# Patient Record
Sex: Female | Born: 1985 | Race: Black or African American | Hispanic: No | Marital: Single | Smoking: Never smoker
Health system: Southern US, Community
[De-identification: ages and names within clinical notes are randomized; demographics above are authoritative.]

## PROBLEM LIST (undated history)

## (undated) DIAGNOSIS — B999 Unspecified infectious disease: Secondary | ICD-10-CM

## (undated) DIAGNOSIS — O24419 Gestational diabetes mellitus in pregnancy, unspecified control: Secondary | ICD-10-CM

## (undated) DIAGNOSIS — L309 Dermatitis, unspecified: Secondary | ICD-10-CM

## (undated) DIAGNOSIS — J45909 Unspecified asthma, uncomplicated: Secondary | ICD-10-CM

---

## 2020-02-22 ENCOUNTER — Inpatient Hospital Stay (HOSPITAL_COMMUNITY)
Admission: EM | Admit: 2020-02-22 | Discharge: 2020-02-22 | Disposition: A | Discharge status: Home / Self Care | Payer: Medicaid Other | Attending: Obstetrics and Gynecology | Admitting: Obstetrics and Gynecology

## 2020-02-22 ENCOUNTER — Encounter (HOSPITAL_COMMUNITY): Payer: Self-pay | Admitting: Emergency Medicine

## 2020-02-22 ENCOUNTER — Other Ambulatory Visit: Payer: Self-pay

## 2020-02-22 DIAGNOSIS — J45909 Unspecified asthma, uncomplicated: Secondary | ICD-10-CM | POA: Insufficient documentation

## 2020-02-22 DIAGNOSIS — Z3A01 Less than 8 weeks gestation of pregnancy: Secondary | ICD-10-CM

## 2020-02-22 DIAGNOSIS — O219 Vomiting of pregnancy, unspecified: Secondary | ICD-10-CM | POA: Diagnosis not present

## 2020-02-22 HISTORY — DX: Unspecified asthma, uncomplicated: J45.909

## 2020-02-22 LAB — CBC WITH DIFFERENTIAL/PLATELET
Abs Immature Granulocytes: 0.02 10*3/uL (ref 0.00–0.07)
Basophils Absolute: 0.1 10*3/uL (ref 0.0–0.1)
Basophils Relative: 1 %
Eosinophils Absolute: 0.1 10*3/uL (ref 0.0–0.5)
Eosinophils Relative: 1 %
HCT: 41.7 % (ref 36.0–46.0)
Hemoglobin: 14.5 g/dL (ref 12.0–15.0)
Immature Granulocytes: 0 %
Lymphocytes Relative: 25 %
Lymphs Abs: 1.9 10*3/uL (ref 0.7–4.0)
MCH: 31.7 pg (ref 26.0–34.0)
MCHC: 34.8 g/dL (ref 30.0–36.0)
MCV: 91.2 fL (ref 80.0–100.0)
Monocytes Absolute: 0.9 10*3/uL (ref 0.1–1.0)
Monocytes Relative: 11 %
Neutro Abs: 4.8 10*3/uL (ref 1.7–7.7)
Neutrophils Relative %: 62 %
Platelets: 381 10*3/uL (ref 150–400)
RBC: 4.57 MIL/uL (ref 3.87–5.11)
RDW: 11.8 % (ref 11.5–15.5)
WBC: 7.6 10*3/uL (ref 4.0–10.5)
nRBC: 0 % (ref 0.0–0.2)

## 2020-02-22 LAB — COMPREHENSIVE METABOLIC PANEL
ALT: 21 U/L (ref 0–44)
AST: 19 U/L (ref 15–41)
Albumin: 4.2 g/dL (ref 3.5–5.0)
Alkaline Phosphatase: 48 U/L (ref 38–126)
Anion gap: 12 (ref 5–15)
BUN: 9 mg/dL (ref 6–20)
CO2: 20 mmol/L — ABNORMAL LOW (ref 22–32)
Calcium: 9.8 mg/dL (ref 8.9–10.3)
Chloride: 102 mmol/L (ref 98–111)
Creatinine, Ser: 0.76 mg/dL (ref 0.44–1.00)
GFR, Estimated: >60 mL/min (ref >60)
Glucose, Bld: 106 mg/dL — ABNORMAL HIGH (ref 70–99)
Potassium: 3.8 mmol/L (ref 3.5–5.1)
Sodium: 134 mmol/L — ABNORMAL LOW (ref 135–145)
Total Bilirubin: 0.6 mg/dL (ref 0.3–1.2)
Total Protein: 7.5 g/dL (ref 6.5–8.1)

## 2020-02-22 LAB — URINALYSIS, ROUTINE W REFLEX MICROSCOPIC
Bilirubin Urine: NEGATIVE
Glucose, UA: NEGATIVE mg/dL
Hgb urine dipstick: NEGATIVE
Ketones, ur: 80 mg/dL — AB
Leukocytes,Ua: NEGATIVE
Nitrite: NEGATIVE
Protein, ur: 30 mg/dL — AB
Specific Gravity, Urine: 1.026 (ref 1.005–1.030)
pH: 6 (ref 5.0–8.0)

## 2020-02-22 LAB — I-STAT BETA HCG BLOOD, ED (MC, WL, AP ONLY): I-stat hCG, quantitative: >2000 m[IU]/mL — ABNORMAL HIGH (ref <5)

## 2020-02-22 LAB — LIPASE, BLOOD: Lipase: 24 U/L (ref 11–51)

## 2020-02-22 MED ORDER — ONDANSETRON 4 MG PO TBDP
4.0000 mg | ORAL_TABLET | Freq: Once | ORAL | Status: AC
  Administered 2020-02-22: 4 mg via ORAL

## 2020-02-22 MED ORDER — METOCLOPRAMIDE HCL 10 MG PO TABS: 10.0000 mg | ORAL_TABLET | Freq: Three times a day (TID) | ORAL | 1 refills | Status: DC

## 2020-02-22 MED ORDER — METOCLOPRAMIDE HCL 5 MG/ML IJ SOLN
10.0000 mg | Freq: Once | INTRAMUSCULAR | Status: AC
  Administered 2020-02-22: 10 mg via INTRAVENOUS
  Filled 2020-02-22: qty 2

## 2020-02-22 MED ORDER — LACTATED RINGERS IV BOLUS
1000.0000 mL | Freq: Once | INTRAVENOUS | Status: AC
  Administered 2020-02-22: 1000 mL via INTRAVENOUS

## 2020-02-22 MED ORDER — ONDANSETRON HCL 4 MG PO TABS: 4.0000 mg | ORAL_TABLET | Freq: Once | ORAL | Status: DC

## 2020-02-22 MED ORDER — ONDANSETRON 4 MG PO TBDP
ORAL_TABLET | ORAL | Status: AC
  Filled 2020-02-22: qty 1

## 2020-02-22 MED ORDER — FAMOTIDINE 20 MG PO TABS: 20.0000 mg | ORAL_TABLET | Freq: Every day | ORAL | 1 refills | Status: DC

## 2020-02-22 MED ORDER — FAMOTIDINE IN NACL 20-0.9 MG/50ML-% IV SOLN
20.0000 mg | Freq: Once | INTRAVENOUS | Status: AC
  Administered 2020-02-22: 20 mg via INTRAVENOUS
  Filled 2020-02-22: qty 50

## 2020-02-22 NOTE — Discharge Instructions (Signed)
Morning Sickness  Morning sickness is when a woman feels nauseous during pregnancy. This nauseous feeling may or may not come with vomiting. It often occurs in the morning, but it can be a problem at any time of day. Morning sickness is most common during the first trimester. In some cases, it may continue throughout pregnancy. Although morning sickness is unpleasant, it is usually harmless unless the woman develops severe and continual vomiting (hyperemesis gravidarum), a condition that requires more intense treatment. What are the causes? The exact cause of this condition is not known, but it seems to be related to normal hormonal changes that occur in pregnancy. What increases the risk? You are more likely to develop this condition if:  You experienced nausea or vomiting before your pregnancy.  You had morning sickness during a previous pregnancy.  You are pregnant with more than one baby, such as twins. What are the signs or symptoms? Symptoms of this condition include:  Nausea.  Vomiting. How is this diagnosed? This condition is usually diagnosed based on your signs and symptoms. How is this treated? In many cases, treatment is not needed for this condition. Making some changes to what you eat may help to control symptoms. Your health care provider may also prescribe or recommend:  Vitamin B6 supplements.  Anti-nausea medicines.  Ginger. Follow these instructions at home: Medicines  Take over-the-counter and prescription medicines only as told by your health care provider. Do not use any prescription, over-the-counter, or herbal medicines for morning sickness without first talking with your health care provider.  Taking multivitamins before getting pregnant can prevent or decrease the severity of morning sickness in most women. Eating and drinking  Eat a piece of dry toast or crackers before getting out of bed in the morning.  Eat 5 or 6 small meals a day.  Eat dry and  bland foods, such as rice or a baked potato. Foods that are high in carbohydrates are often helpful.  Avoid greasy, fatty, and spicy foods.  Have someone cook for you if the smell of any food causes nausea and vomiting.  If you feel nauseous after taking prenatal vitamins, take the vitamins at night or with a snack.  Snack on protein foods between meals if you are hungry. Nuts, yogurt, and cheese are good options.  Drink fluids throughout the day.  Try ginger ale made with real ginger, ginger tea made from fresh grated ginger, or ginger candies. General instructions  Do not use any products that contain nicotine or tobacco, such as cigarettes and e-cigarettes. If you need help quitting, ask your health care provider.  Get an air purifier to keep the air in your house free of odors.  Get plenty of fresh air.  Try to avoid odors that trigger your nausea.  Consider trying these methods to help relieve symptoms: ? Wearing an acupressure wristband. These wristbands are often worn for seasickness. ? Acupuncture. Contact a health care provider if:  Your home remedies are not working and you need medicine.  You feel dizzy or light-headed.  You are losing weight. Get help right away if:  You have persistent and uncontrolled nausea and vomiting.  You faint.  You have severe pain in your abdomen. Summary  Morning sickness is when a woman feels nauseous during pregnancy. This nauseous feeling may or may not come with vomiting.  Morning sickness is most common during the first trimester.  It often occurs in the morning, but it can be a problem at   any time of day.  In many cases, treatment is not needed for this condition. Making some changes to what you eat may help to control symptoms. This information is not intended to replace advice given to you by your health care provider. Make sure you discuss any questions you have with your health care provider. Document Revised:  03/04/2017 Document Reviewed: 04/24/2016 Elsevier Patient Education  2020 [place].        Hyperemesis Gravidarum Hyperemesis gravidarum is a severe form of nausea and vomiting that happens during pregnancy. Hyperemesis is worse than morning sickness. It may cause you to have nausea or vomiting all day for many days. It may keep you from eating and drinking enough food and liquids, which can lead to dehydration, malnutrition, and weight loss. Hyperemesis usually occurs during the first half (the first 20 weeks) of pregnancy. It often goes away once a woman is in her second half of pregnancy. However, sometimes hyperemesis continues through an entire pregnancy. What are the causes? The cause of this condition is not known. It may be related to changes in chemicals (hormones) in the body during pregnancy, such as the high level of pregnancy hormone (human chorionic gonadotropin) or the increase in the female sex hormone (estrogen). What are the signs or symptoms? Symptoms of this condition include:  Nausea that does not go away.  Vomiting that does not allow you to keep any food down.  Weight loss.  Body fluid loss (dehydration).  Having no desire to eat, or not liking food that you have previously enjoyed. How is this diagnosed? This condition may be diagnosed based on:  A physical exam.  Your medical history.  Your symptoms.  Blood tests.  Urine tests. How is this treated? This condition is managed by controlling symptoms. This may include:  Following an eating plan. This can help lessen nausea and vomiting.  Taking prescription medicines. An eating plan and medicines are often used together to help control symptoms. If medicines do not help relieve nausea and vomiting, you may need to receive fluids through an IV at the hospital. Follow these instructions at home: Eating and drinking   Avoid the following: ? Drinking fluids with meals. Try not to drink anything  during the 30 minutes before and after your meals. ? Drinking more than 1 cup of fluid at a time. ? Eating foods that trigger your symptoms. These may include spicy foods, coffee, high-fat foods, very sweet foods, and acidic foods. ? Skipping meals. Nausea can be more intense on an empty stomach. If you cannot tolerate food, do not force it. Try sucking on ice chips or other frozen items and make up for missed calories later. ? Lying down within 2 hours after eating. ? Being exposed to environmental triggers. These may include food smells, smoky rooms, closed spaces, rooms with strong smells, warm or humid places, overly loud and noisy rooms, and rooms with motion or flickering lights. Try eating meals in a well-ventilated area that is free of strong smells. ? Quick and sudden changes in your movement. ? Taking iron pills and multivitamins that contain iron. If you take prescription iron pills, do not stop taking them unless your health care provider approves. ? Preparing food. The smell of food can spoil your appetite or trigger nausea.  To help relieve your symptoms: ? Listen to your body. Everyone is different and has different preferences. Find what works best for you. ? Eat and drink slowly. ? Eat 5-6 small meals  daily instead of 3 large meals. Eating small meals and snacks can help you avoid an empty stomach. ? In the morning, before getting out of bed, eat a couple of crackers to avoid moving around on an empty stomach. ? Try eating starchy foods as these are usually tolerated well. Examples include cereal, toast, bread, potatoes, pasta, rice, and pretzels. ? Include at least 1 serving of protein with your meals and snacks. Protein options include lean meats, poultry, seafood, beans, nuts, nut butters, eggs, cheese, and yogurt. ? Try eating a protein-rich snack before bed. Examples of a protein-rick snack include cheese and crackers or a peanut butter sandwich made with 1 slice of whole-wheat  bread and 1 tsp (5 g) of peanut butter. ? Eat or suck on things that have ginger in them. It may help relieve nausea. Add  tsp ground ginger to hot tea or choose ginger tea. ? Try drinking 100% fruit juice or an electrolyte drink. An electrolyte drink contains sodium, potassium, and chloride. ? Drink fluids that are cold, clear, and carbonated or sour. Examples include lemonade, ginger ale, lemon-lime soda, ice water, and sparkling water. ? Brush your teeth or use a mouth rinse after meals. ? Talk with your health care provider about starting a supplement of vitamin B6. General instructions  Take over-the-counter and prescription medicines only as told by your health care provider.  Follow instructions from your health care provider about eating or drinking restrictions.  Continue to take your prenatal vitamins as told by your health care provider. If you are having trouble taking your prenatal vitamins, talk with your health care provider about different options.  Keep all follow-up and pre-birth (prenatal) visits as told by your health care provider. This is important. Contact a health care provider if:  You have pain in your abdomen.  You have a severe headache.  You have vision problems.  You are losing weight.  You feel weak or dizzy. Get help right away if:  You cannot drink fluids without vomiting.  You vomit blood.  You have constant nausea and vomiting.  You are very weak.  You faint.  You have a fever and your symptoms suddenly get worse. Summary  Hyperemesis gravidarum is a severe form of nausea and vomiting that happens during pregnancy.  Making some changes to your eating habits may help relieve nausea and vomiting.  This condition may be managed with medicine.  If medicines do not help relieve nausea and vomiting, you may need to receive fluids through an IV at the hospital. This information is not intended to replace advice given to you by your health  care provider. Make sure you discuss any questions you have with your health care provider. Document Revised: 04/11/2017 Document Reviewed: 11/19/2015 Elsevier Patient Education  2020 [employer].                        Safe Medications in Pregnancy    Acne: Benzoyl Peroxide Salicylic Acid  Backache/Headache: Tylenol: 2 regular strength every 4 hours OR              2 Extra strength every 6 hours  Colds/Coughs/Allergies: Benadryl (alcohol free) 25 mg every 6 hours as needed Breath right strips Claritin Cepacol throat lozenges Chloraseptic throat spray Cold-Eeze- up to three times per day Cough drops, alcohol free Flonase (by prescription only) Guaifenesin Mucinex Robitussin DM (plain only, alcohol free) Saline nasal spray/drops Sudafed (pseudoephedrine) & Actifed ** use only after  [redacted] weeks gestation and if you do not have high blood pressure Tylenol Vicks Vaporub Zinc lozenges Zyrtec   Constipation: Colace Ducolax suppositories Fleet enema Glycerin suppositories Metamucil Milk of magnesia Miralax Senokot Smooth move tea  Diarrhea: Kaopectate Imodium A-D  *NO pepto Bismol  Hemorrhoids: Anusol Anusol HC Preparation H Tucks  Indigestion: Tums Maalox Mylanta Zantac  Pepcid  Insomnia: Benadryl (alcohol free) 25mg every 6 hours as needed Tylenol PM Unisom, no Gelcaps  Leg Cramps: Tums MagGel  Nausea/Vomiting:  Bonine Dramamine Emetrol Ginger extract Sea bands Meclizine  Nausea medication to take during pregnancy:  Unisom (doxylamine succinate 25 mg tablets) Take one tablet daily at bedtime. If symptoms are not adequately controlled, the dose can be increased to a maximum recommended dose of two tablets daily (1/2 tablet in the morning, 1/2 tablet mid-afternoon and one at bedtime). Vitamin B6 100mg tablets. Take one tablet twice a day (up to 200 mg per day).  Skin Rashes: Aveeno products Benadryl cream or 25mg every 6 hours as  needed Calamine Lotion 1% cortisone cream  Yeast infection: Gyne-lotrimin 7 Monistat 7   **If taking multiple medications, please check labels to avoid duplicating the same active ingredients **take medication as directed on the label ** Do not exceed 4000 mg of tylenol in 24 hours **Do not take medications that contain aspirin or ibuprofen          Prenatal Care Providers           [facility] - accepts patients without insurance  Phone: [phone]  [facility] @ [facility]   Phone: [phone]  [facility] @[place]       Phone: [phone]            [facility] @ [place]     Phone: [phone]          [facility] @ [place]   Phone: [phone]  [facility] @ [facility] - accepts patients without insurance  Phone: [phone]  [facility] @ [facility] Phone: [phone]     [facility] - accepts patients without insurance Phone: [phone]  [facility]  Phone: [phone]  [facility] Phone: [phone]  [facility] Phone: [phone]  [facility] Phone: [phone]  [facility] Phone: [phone]  [facility]  Phone: [phone]   

## 2020-02-22 NOTE — MAU Note (Signed)
Presents with c/o N/V, reports can't keep anything down including water.  States been vomiting x3 days.  Reports feeling weak and light headed.

## 2020-02-22 NOTE — ED Triage Notes (Signed)
Emergency Medicine Provider Triage Evaluation Note  Claudia Lambert , a 34 y.o. female  was evaluated in triage.  Pt complains of vomiting in early pregnancy.  Review of Systems  Positive: Vomiting and abdominal pajn Negative: Vaginal bleeding  Physical Exam  BP (!) 130/103   Pulse (!) 105   Temp 98.5 F (36.9 C) (Oral)   Resp 20   Ht 5\' 6"  (1.676 m)   Wt 70.3 kg   LMP 01/01/2020   SpO2 99%   BMI 25.02 kg/m  Gen:   Awake, no distress   HEENT:  Atraumatic  Resp:  Normal effort  Cardiac:  Normal rate  Abd:   Nondistended, nontender  MSK:   Moves extremities without difficulty  Neuro:  Speech clear   Medical Decision Making  Medically screening exam initiated at 12:09 PM.  Appropriate orders placed.  [name] was informed that the remainder of the evaluation will be completed by another provider, this initial triage assessment does not replace that evaluation, and the importance of remaining in the ED until their evaluation is complete.  Clinical Impression  Patient with vomiting in early pregnancy  Accepted by APP Raelyn Number in MAU 12:12 PM    [name], PA-C 02/22/20 1213

## 2020-02-22 NOTE — ED Triage Notes (Signed)
States is pregnant, and has been vomiting constantly --- unable keep anything down. Started this last weekend.   LMP 9/28-- home test positive.

## 2020-02-22 NOTE — MAU Provider Note (Signed)
History     CSN: [id number]  Arrival date and time: 02/22/20 1116   First Provider Initiated Contact with Patient 02/22/20 1506      Chief Complaint  Patient presents with   Emesis During Pregnancy   Nausea   Emesis   Ms. Claudia Lambert is a 34 y.o. [id number] at [redacted]w[redacted]d who presents to MAU for nausea and vomiting. Pt not actively vomiting at this time, but reports she did get Zofran in the ED.  Onset: one week Location: stomach Duration: one week, worsening over the past three days Character: constant nausea, vomiting xunknown amount in past 24 hours, pt states "I have not left my bathroom in the past 24 hours" Aggravating/Associated: none/none Relieving: none Treatment: Zofran in ED  Pt denies VB, LOF, ctx, decreased FM, vaginal discharge/odor/itching. Pt denies abdominal pain, constipation, diarrhea, or urinary problems. Pt denies fever, chills, fatigue, sweating or changes in appetite. Pt denies SOB or chest pain. Pt denies dizziness, HA, light-headedness, weakness.  Problems this pregnancy include: pt has not yet been seen. Allergies? NKDA Current medications/supplements? none Prenatal care provider? Pt requests list of OB providers   OB History    Gravida  4   Para  1   Term  1   Preterm  0   AB  2   Living  1     SAB  1   TAB  1   Ectopic      Multiple      Live Births  1           Past Medical History:  Diagnosis Date   Asthma     Past Surgical History:  Procedure Laterality Date   CESAREAN SECTION     Placenta Previa    History reviewed. No pertinent family history.  Social History   Tobacco Use   Smoking status: Never Smoker   Smokeless tobacco: Never Used  Vaping Use   Vaping Use: Never used  Substance Use Topics   Alcohol use: Not Currently   Drug use: Not Currently    Types: Marijuana    Allergies: No Known Allergies  No medications prior to admission.    Review of Systems  Constitutional: Negative for  chills, diaphoresis, fatigue and fever.  Eyes: Negative for visual disturbance.  Respiratory: Negative for shortness of breath.   Cardiovascular: Negative for chest pain.  Gastrointestinal: Positive for nausea and vomiting. Negative for abdominal pain, constipation and diarrhea.  Genitourinary: Negative for dysuria, flank pain, frequency, pelvic pain, urgency, vaginal bleeding and vaginal discharge.  Neurological: Negative for dizziness, weakness, light-headedness and headaches.   Physical Exam   Blood pressure 121/76, pulse 82, temperature 98.1 F (36.7 C), temperature source Oral, resp. rate 18, height 5\' 6"  (1.676 m), weight 65.1 kg, last menstrual period 01/01/2020, SpO2 100 %.  Patient Vitals for the past 24 hrs:  BP Temp Temp src Pulse Resp SpO2 Height Weight  02/22/20 1710 -- 98.1 F (36.7 C) Oral -- 18 -- -- --  02/22/20 1457 121/76 98.6 F (37 C) Oral 82 18 100 % -- --  02/22/20 1300 108/79 98.7 F (37.1 C) Oral 92 20 100 % -- --  02/22/20 1256 -- -- -- -- -- -- 5\' 6"  (1.676 m) 65.1 kg  02/22/20 1121 (!) 130/103 98.5 F (36.9 C) Oral (!) 105 20 99 % 5\' 6"  (1.676 m) 70.3 kg   Physical Exam Vitals and nursing note reviewed.  Constitutional:      General: She is not  in acute distress.    Appearance: Normal appearance. She is not ill-appearing, toxic-appearing or diaphoretic.  HENT:     Head: Normocephalic and atraumatic.  Pulmonary:     Effort: Pulmonary effort is normal.  Neurological:     Mental Status: She is alert and oriented to person, place, and time.  Psychiatric:        Mood and Affect: Mood normal.        Behavior: Behavior normal.        Thought Content: Thought content normal.        Judgment: Judgment normal.    Results for orders placed or performed during the hospital encounter of 02/22/20 (from the past 24 hour(s))  CBC with Differential     Status: None   Collection Time: 02/22/20 11:35 AM  Result Value Ref Range   WBC 7.6 4.0 - 10.5 K/uL   RBC  4.57 3.87 - 5.11 MIL/uL   Hemoglobin 14.5 12.0 - 15.0 g/dL   HCT 09.841.7 36 - 46 %   MCV 91.2 80.0 - 100.0 fL   MCH 31.7 26.0 - 34.0 pg   MCHC 34.8 30.0 - 36.0 g/dL   RDW 11.911.8 14.711.5 - 82.915.5 %   Platelets 381 150 - 400 K/uL   nRBC 0.0 0.0 - 0.2 %   Neutrophils Relative % 62 %   Neutro Abs 4.8 1.7 - 7.7 K/uL   Lymphocytes Relative 25 %   Lymphs Abs 1.9 0.7 - 4.0 K/uL   Monocytes Relative 11 %   Monocytes Absolute 0.9 0.1 - 1.0 K/uL   Eosinophils Relative 1 %   Eosinophils Absolute 0.1 0.0 - 0.5 K/uL   Basophils Relative 1 %   Basophils Absolute 0.1 0.0 - 0.1 K/uL   Immature Granulocytes 0 %   Abs Immature Granulocytes 0.02 0.00 - 0.07 K/uL  Lipase, blood     Status: None   Collection Time: 02/22/20 11:35 AM  Result Value Ref Range   Lipase 24 11 - 51 U/L  Comprehensive metabolic panel     Status: Abnormal   Collection Time: 02/22/20 11:35 AM  Result Value Ref Range   Sodium 134 (L) 135 - 145 mmol/L   Potassium 3.8 3.5 - 5.1 mmol/L   Chloride 102 98 - 111 mmol/L   CO2 20 (L) 22 - 32 mmol/L   Glucose, Bld 106 (H) 70 - 99 mg/dL   BUN 9 6 - 20 mg/dL   Creatinine, Ser 5.620.76 0.44 - 1.00 mg/dL   Calcium 9.8 8.9 - 13.010.3 mg/dL   Total Protein 7.5 6.5 - 8.1 g/dL   Albumin 4.2 3.5 - 5.0 g/dL   AST 19 15 - 41 U/L   ALT 21 0 - 44 U/L   Alkaline Phosphatase 48 38 - 126 U/L   Total Bilirubin 0.6 0.3 - 1.2 mg/dL   GFR, Estimated >86>60 >57>60 mL/min   Anion gap 12 5 - 15  I-Stat Beta hCG blood, ED ([facility], [facility], [facility] only)     Status: Abnormal   Collection Time: 02/22/20 11:48 AM  Result Value Ref Range   I-stat hCG, quantitative >2,000.0 (H) <5 mIU/mL   Comment 3          Urinalysis, Routine w reflex microscopic Urine, Clean Catch     Status: Abnormal   Collection Time: 02/22/20  1:03 PM  Result Value Ref Range   Color, Urine YELLOW YELLOW   APPearance HAZY (A) CLEAR   Specific Gravity, Urine 1.026 1.005 - 1.030  pH 6.0 5.0 - 8.0   Glucose, UA NEGATIVE NEGATIVE mg/dL   Hgb urine dipstick NEGATIVE  NEGATIVE   Bilirubin Urine NEGATIVE NEGATIVE   Ketones, ur 80 (A) NEGATIVE mg/dL   Protein, ur 30 (A) NEGATIVE mg/dL   Nitrite NEGATIVE NEGATIVE   Leukocytes,Ua NEGATIVE NEGATIVE   RBC / HPF 0-5 0 - 5 RBC/hpf   WBC, UA 0-5 0 - 5 WBC/hpf   Bacteria, UA RARE (A) NONE SEEN   Squamous Epithelial / LPF 0-5 0 - 5   Mucus PRESENT    No results found.  MAU Course  Procedures  MDM -N/V in pregnancy -weight today 65.1kg -UA: hazy/80ketones/30PRO/rare bacteria -CBC w/Diff: WNL -CMP: no abnormalities requiring treatment -1L LR + 20mg  Pepcid + 10mg  Reglan given, pt reports NV now resolved -pt able to urinate after fluids -PO challenge successful -pt discharged to home in stable condition  Orders Placed This Encounter  Procedures   CBC with Differential    Standing Status:   Standing    Number of Occurrences:   1   Lipase, blood    Standing Status:   Standing    Number of Occurrences:   1   Comprehensive metabolic panel    Standing Status:   Standing    Number of Occurrences:   1   Urinalysis, Routine w reflex microscopic Urine, Clean Catch    Standing Status:   Standing    Number of Occurrences:   1   I-Stat Beta hCG blood, ED ([facility], [facility], [facility] only)    Standing Status:   Standing    Number of Occurrences:   1   Insert peripheral IV    Standing Status:   Standing    Number of Occurrences:   1   Discharge patient    Order Specific Question:   Discharge disposition    Answer:   01-Home or Self Care [1]    Order Specific Question:   Discharge patient date    Answer:   02/22/2020   Meds ordered this encounter  Medications   DISCONTD: ondansetron (ZOFRAN) tablet 4 mg   ondansetron (ZOFRAN-ODT) disintegrating tablet 4 mg   ondansetron (ZOFRAN-ODT) 4 MG disintegrating tablet    Cobb, Karen   : cabinet override   lactated ringers bolus 1,000 mL   metoCLOPramide (REGLAN) injection 10 mg   famotidine (PEPCID) IVPB 20 mg premix   famotidine (PEPCID) 20 MG tablet     Sig: Take 1 tablet (20 mg total) by mouth daily.    Dispense:  30 tablet    Refill:  1    Order Specific Question:   Supervising Provider    Answer:   [[id number]]   metoCLOPramide (REGLAN) 10 MG tablet    Sig: Take 1 tablet (10 mg total) by mouth 3 (three) times daily with meals.    Dispense:  90 tablet    Refill:  1    Order Specific Question:   Supervising Provider    Answer:   02/24/2020 [[id number]]    Assessment and Plan   1. Nausea and vomiting in pregnancy   2. [redacted] weeks gestation of pregnancy     Allergies as of 02/22/2020   No Known Allergies     Medication List    TAKE these medications   famotidine 20 MG tablet Commonly known as: Pepcid Take 1 tablet (20 mg total) by mouth daily.   metoCLOPramide 10 MG tablet Commonly known as: REGLAN Take 1 tablet (  10 mg total) by mouth 3 (three) times daily with meals.       -safe meds in pregnancy list given, discussed use of Vitamin B6 -pt advised to take medications around the clock and not to stop taking if feeling better -discussed nonpharmacologic and pharmacologic treatments of N/V -discussed normal expectations for N/V in pregnancy -pt discharged to home in stable condition  Joni Reining E Blaze Sandin 02/22/2020, 5:21 PM

## 2020-03-04 ENCOUNTER — Inpatient Hospital Stay (HOSPITAL_COMMUNITY)
Admission: EM | Admit: 2020-03-04 | Discharge: 2020-03-04 | Disposition: A | Discharge status: Home / Self Care | Payer: Medicaid Other | Attending: Emergency Medicine | Admitting: Emergency Medicine

## 2020-03-04 ENCOUNTER — Inpatient Hospital Stay (HOSPITAL_COMMUNITY): Payer: Medicaid Other

## 2020-03-04 ENCOUNTER — Encounter: Payer: Self-pay | Admitting: Advanced Practice Midwife

## 2020-03-04 ENCOUNTER — Encounter (HOSPITAL_COMMUNITY): Payer: Self-pay | Admitting: Obstetrics & Gynecology

## 2020-03-04 ENCOUNTER — Other Ambulatory Visit: Payer: Self-pay

## 2020-03-04 DIAGNOSIS — R109 Unspecified abdominal pain: Secondary | ICD-10-CM

## 2020-03-04 DIAGNOSIS — O26899 Other specified pregnancy related conditions, unspecified trimester: Secondary | ICD-10-CM | POA: Diagnosis not present

## 2020-03-04 DIAGNOSIS — R1030 Lower abdominal pain, unspecified: Secondary | ICD-10-CM | POA: Insufficient documentation

## 2020-03-04 DIAGNOSIS — J45909 Unspecified asthma, uncomplicated: Secondary | ICD-10-CM | POA: Diagnosis not present

## 2020-03-04 DIAGNOSIS — O24419 Gestational diabetes mellitus in pregnancy, unspecified control: Secondary | ICD-10-CM | POA: Diagnosis not present

## 2020-03-04 DIAGNOSIS — O99511 Diseases of the respiratory system complicating pregnancy, first trimester: Secondary | ICD-10-CM | POA: Diagnosis not present

## 2020-03-04 DIAGNOSIS — Z3490 Encounter for supervision of normal pregnancy, unspecified, unspecified trimester: Secondary | ICD-10-CM

## 2020-03-04 DIAGNOSIS — Z3A09 9 weeks gestation of pregnancy: Secondary | ICD-10-CM | POA: Insufficient documentation

## 2020-03-04 DIAGNOSIS — O0001 Abdominal pregnancy with intrauterine pregnancy: Secondary | ICD-10-CM | POA: Diagnosis not present

## 2020-03-04 DIAGNOSIS — F1290 Cannabis use, unspecified, uncomplicated: Secondary | ICD-10-CM | POA: Insufficient documentation

## 2020-03-04 DIAGNOSIS — O209 Hemorrhage in early pregnancy, unspecified: Secondary | ICD-10-CM | POA: Diagnosis not present

## 2020-03-04 HISTORY — DX: Gestational diabetes mellitus in pregnancy, unspecified control: O24.419

## 2020-03-04 HISTORY — DX: Dermatitis, unspecified: L30.9

## 2020-03-04 HISTORY — DX: Unspecified infectious disease: B99.9

## 2020-03-04 LAB — CBC
HCT: 36.2 % (ref 36.0–46.0)
Hemoglobin: 12.7 g/dL (ref 12.0–15.0)
MCH: 31.4 pg (ref 26.0–34.0)
MCHC: 35.1 g/dL (ref 30.0–36.0)
MCV: 89.4 fL (ref 80.0–100.0)
Platelets: 284 10*3/uL (ref 150–400)
RBC: 4.05 MIL/uL (ref 3.87–5.11)
RDW: 11.9 % (ref 11.5–15.5)
WBC: 9.5 10*3/uL (ref 4.0–10.5)
nRBC: 0 % (ref 0.0–0.2)

## 2020-03-04 LAB — RAPID URINE DRUG SCREEN, HOSP PERFORMED
Amphetamines: NOT DETECTED
Barbiturates: NOT DETECTED
Benzodiazepines: NOT DETECTED
Cocaine: NOT DETECTED
Opiates: NOT DETECTED
Tetrahydrocannabinol: POSITIVE — AB

## 2020-03-04 LAB — URINALYSIS, ROUTINE W REFLEX MICROSCOPIC
Bilirubin Urine: NEGATIVE
Glucose, UA: NEGATIVE mg/dL
Hgb urine dipstick: NEGATIVE
Ketones, ur: 20 mg/dL — AB
Leukocytes,Ua: NEGATIVE
Nitrite: NEGATIVE
Protein, ur: NEGATIVE mg/dL
Specific Gravity, Urine: 1.016 (ref 1.005–1.030)
pH: 6 (ref 5.0–8.0)

## 2020-03-04 LAB — HCG, QUANTITATIVE, PREGNANCY: hCG, Beta Chain, Quant, S: 140735 m[IU]/mL — ABNORMAL HIGH (ref <5)

## 2020-03-04 LAB — ABO/RH: ABO/RH(D): O POS

## 2020-03-04 MED ORDER — ONDANSETRON 4 MG PO TBDP
4.0000 mg | ORAL_TABLET | Freq: Once | ORAL | Status: AC
  Administered 2020-03-04: 4 mg via ORAL
  Filled 2020-03-04: qty 1

## 2020-03-04 NOTE — Progress Notes (Signed)
Pt c/o nausea, requesting meds for nausea prior to discharge.

## 2020-03-04 NOTE — MAU Note (Signed)
Started cramping and having spotting this morning.  Was sent up from ED.  In triage stated she felt very dizzy.  Took pt to rm and assisted with changing clothes.  Placed in bed.  VSS, warm blanket given.  Lt brown spots noted on pad.

## 2020-03-04 NOTE — ED Provider Notes (Signed)
MSE was initiated and I personally evaluated the patient and placed orders (if any) at  2:07 PM on March 04, 2020.  Patient is 34 year old G68, P1 approximately 9-week pregnant female presented today with sudden onset of left-sided pelvic/left lower quadrant abdominal pain and vaginal bleeding that began this morning.  She states was initially some heavy bleeding which she is having difficulty quantifying followed by spotting.  She states the pain is constant.  Nonradiating.  Discussed with nurse practitioner at [facility]U.  They will accept patient for further evaluation and work-up.   Vital signs are notably within normal limits with no tachycardia or hypotension. CONSTITUTIONAL:  well-appearing, NAD NEURO:  Alert and oriented x 3, no focal deficits EYES:  pupils equal and reactive ENT/NECK:  trachea midline, no JVD CARDIO:  reg rate, reg rhythm, well-perfused PULM:  None labored breathing GI/GU:  Abdomen non-distended, soft, tender to palpation in the left sided lower quadrant of the abdomen/pelvic region. MSK/SPINE:  No gross deformities, no edema SKIN:  no rash obvious, atraumatic, no ecchymosis  PSYCH:  Appropriate speech and behavior  The patient appears stable so that the remainder of the MSE may be completed by another provider.   Solon Augusta [place], [place] 03/04/20 1413    Linwood Dibbles, MD 03/06/20 [phone]

## 2020-03-04 NOTE — Discharge Instructions (Signed)
Abdominal Pain During Pregnancy  Belly (abdominal) pain is common during pregnancy. There are many possible causes. Most of the time, it is not a serious problem. Other times, it can be a sign that something is wrong with the pregnancy. Always tell your doctor if you have belly pain. Follow these instructions at home:  Do not have sex or put anything in your vagina until your pain goes away completely.  Get plenty of rest until your pain gets better.  Drink enough fluid to keep your pee (urine) pale yellow.  Take over-the-counter and prescription medicines only as told by your doctor.  Keep all follow-up visits as told by your doctor. This is important. Contact a doctor if:  Your pain continues or gets worse after resting.  You have lower belly pain that: ? Comes and goes at regular times. ? Spreads to your back. ? Feels like menstrual cramps.  You have pain or burning when you pee (urinate). Get help right away if:  You have a fever or chills.  You have vaginal bleeding.  You are leaking fluid from your vagina.  You are passing tissue from your vagina.  You throw up (vomit) for more than 24 hours.  You have watery poop (diarrhea) for more than 24 hours.  Your baby is moving less than usual.  You feel very weak or faint.  You have shortness of breath.  You have very bad pain in your upper belly. Summary  Belly (abdominal) pain is common during pregnancy. There are many possible causes.  If you have belly pain during pregnancy, tell your doctor right away.  Keep all follow-up visits as told by your doctor. This is important. This information is not intended to replace advice given to you by your health care provider. Make sure you discuss any questions you have with your health care provider. Document Revised: 07/10/2018 Document Reviewed: 06/24/2016 Elsevier Patient Education  2020 Elsevier Inc.  

## 2020-03-04 NOTE — Progress Notes (Signed)
Pt left prior to waiting for 20 minute evaluation period post medication administration.

## 2020-03-04 NOTE — Progress Notes (Signed)
Pt instructed she needs remain in unit for 20 minutes post medication for evaluation of allergic reaction.  Pt informed RN she's taken medication before and can't wait.

## 2020-03-04 NOTE — [facility]U Provider Note (Signed)
History     CSN: [id number]  Arrival date and time: 03/04/20 1329   First Provider Initiated Contact with Patient 03/04/20 1537      Chief Complaint  Patient presents with   Vaginal Bleeding   Abdominal Pain   HPI Claudia Lambert is a 34 y.o. D6L8756 at [redacted]w[redacted]d who presents to [facility] from [facility] with chief complaint of lower abdominal and left lower quadrant pain, dizziness and vaginal spotting. Her abdominal pain and vaginal spotting are new onset today, unchanged since onset. She has not had a viability scan.   On initial CNM assessment in MAU patient states her primary complaint of feeling cold. She denies abdominal pain and is resting in left lateral. She endorses seeing intermittent brown spots today. She references previous episodes of heavy bleeding but denies same to CNM on arrival.   Patient states she has not eaten today.  OB History    Gravida  4   Para  1   Term  1   Preterm  0   AB  2   Living  1     SAB  1   TAB  1   Ectopic      Multiple      Live Births  1           Past Medical History:  Diagnosis Date   Asthma    Eczema    Gestational diabetes    Infection    UTI    Past Surgical History:  Procedure Laterality Date   CESAREAN SECTION     Placenta Previa    Family History  Problem Relation Age of Onset   Fibroids Mother     Social History   Tobacco Use   Smoking status: Never Smoker   Smokeless tobacco: Never Used  Vaping Use   Vaping Use: Never used  Substance Use Topics   Alcohol use: Not Currently    Comment: occ, "not really a drinker"   Drug use: Not Currently    Types: Marijuana    Comment: last was Sept 2021    Allergies: No Known Allergies  Medications Prior to Admission  Medication Sig Dispense Refill Last Dose   famotidine (PEPCID) 20 MG tablet Take 1 tablet (20 mg total) by mouth daily. 30 tablet 1 03/04/2020 at Unknown time   metoCLOPramide (REGLAN) 10 MG tablet Take 1 tablet (10 mg total) by  mouth 3 (three) times daily with meals. 90 tablet 1 03/04/2020 at 1230   Prenatal Vit-Fe Fumarate-FA (PRENATAL VITAMIN PO) Take by mouth.   Past Week at Unknown time    Review of Systems  Gastrointestinal: Positive for abdominal pain.  Genitourinary: Positive for vaginal bleeding.  All other systems reviewed and are negative.  Physical Exam   Blood pressure 134/88, pulse (!) 102, temperature 98.4 F (36.9 C), temperature source Oral, resp. rate 16, last menstrual period 01/01/2020, SpO2 100 %.  Physical Exam Vitals and nursing note reviewed. Exam conducted with a chaperone present.  Constitutional:      Appearance: She is well-developed.  Cardiovascular:     Rate and Rhythm: Normal rate.     Heart sounds: Normal heart sounds.  Pulmonary:     Effort: Pulmonary effort is normal.     Breath sounds: Normal breath sounds.  Abdominal:     General: Bowel sounds are normal.     Palpations: Abdomen is soft.     Tenderness: There is no abdominal tenderness. There is no right CVA tenderness or  left CVA tenderness.  Genitourinary:    Comments: Deferred pending ultrasound results Skin:    General: Skin is warm.     Capillary Refill: Capillary refill takes less than 2 seconds.  Neurological:     Mental Status: She is alert.     MAU Course  Procedures  --Returned to patient's room at 1615. Patient verbalizing frustration to multiple staff members re: growling stomach, feeling hungry. Explained that ectopic pregnancy is obstetric emergency and would necessitate urgent transition to OR. Confirmed with patient staying NPO is component of keeping her safe until ultrasound result is received  --Patient vomiting just prior to discharge, given Zofran ODT. Patient states she cannot stay 20 minutes ot assess for reaction  --Pelvic exam deferred based on ultrasound results. Patient declines pelvic following ultrasound  Patient Vitals for the past 24 hrs:  BP Temp Temp src Pulse Resp SpO2   03/04/20 1730 -- 98.1 F (36.7 C) Oral -- 18 100 %  03/04/20 1504 134/88 -- -- (!) 102 16 100 %  03/04/20 1350 124/81 98.4 F (36.9 C) Oral 94 15 100 %   Results for orders placed or performed during the hospital encounter of 03/04/20 (from the past 24 hour(s))  CBC     Status: None   Collection Time: 03/04/20  4:22 PM  Result Value Ref Range   WBC 9.5 4.0 - 10.5 K/uL   RBC 4.05 3.87 - 5.11 MIL/uL   Hemoglobin 12.7 12.0 - 15.0 g/dL   HCT 30.8 36 - 46 %   MCV 89.4 80.0 - 100.0 fL   MCH 31.4 26.0 - 34.0 pg   MCHC 35.1 30.0 - 36.0 g/dL   RDW 65.7 84.6 - 96.2 %   Platelets 284 150 - 400 K/uL   nRBC 0.0 0.0 - 0.2 %  ABO/Rh     Status: None   Collection Time: 03/04/20  4:22 PM  Result Value Ref Range   ABO/RH(D) O POS    No rh immune globuloin      NOT A RH IMMUNE GLOBULIN CANDIDATE, PT RH POSITIVE Performed at [facility], [address] [place], [place] [address]   hCG, quantitative, pregnancy     Status: Abnormal   Collection Time: 03/04/20  4:22 PM  Result Value Ref Range   hCG, Beta Chain, Quant, S 140,735 (H) <5 mIU/mL  Urinalysis, Routine w reflex microscopic Urine, Clean Catch     Status: Abnormal   Collection Time: 03/04/20  5:32 PM  Result Value Ref Range   Color, Urine YELLOW YELLOW   APPearance HAZY (A) CLEAR   Specific Gravity, Urine 1.016 1.005 - 1.030   pH 6.0 5.0 - 8.0   Glucose, UA NEGATIVE NEGATIVE mg/dL   Hgb urine dipstick NEGATIVE NEGATIVE   Bilirubin Urine NEGATIVE NEGATIVE   Ketones, ur 20 (A) NEGATIVE mg/dL   Protein, ur NEGATIVE NEGATIVE mg/dL   Nitrite NEGATIVE NEGATIVE   Leukocytes,Ua NEGATIVE NEGATIVE  Rapid urine drug screen (hospital performed)     Status: Abnormal   Collection Time: 03/04/20  5:32 PM  Result Value Ref Range   Opiates NONE DETECTED NONE DETECTED   Cocaine NONE DETECTED NONE DETECTED   Benzodiazepines NONE DETECTED NONE DETECTED   Amphetamines NONE DETECTED NONE DETECTED   Tetrahydrocannabinol POSITIVE (A) NONE  DETECTED   Barbiturates NONE DETECTED NONE DETECTED   US OB Comp Less 14 Wks  Result Date: 03/04/2020 CLINICAL DATA:  Cramping, spotting since this morning EXAM: OBSTETRIC <14 WK ULTRASOUND  TECHNIQUE: Transabdominal ultrasound was performed for evaluation of the gestation as well as the maternal uterus and adnexal regions. COMPARISON:  None. FINDINGS: Intrauterine gestational sac: Single Yolk sac:  Visualized. Embryo:  Visualized. Cardiac Activity: Visualized. Heart Rate: 167 bpm CRL: 22.7 mm   8 w 6 d                  [place] EDC: 10/08/2020 Subchorionic hemorrhage:  None visualized. Maternal uterus/adnexae: Left ovary measures 3.6 x 2.9 x 2.4 cm, with probable corpus luteum cyst. Right ovary measures 1.9 x 2.1 x 1.4 cm. No free fluid or pelvic mass. IMPRESSION: 1. Single live intrauterine pregnancy as above, estimated age 38 weeks and 6 days. Electronically Signed   By: Sharlet Salina M.D.   On: 03/04/2020 17:34   Assessment and Plan  --34 y.o. D3T7017 with [facility] at [redacted]w[redacted]d c/w formal ultrasound today --THC+, abstinence advised, potential source of dizziness --Blood type O POS --Discharge home in stable condition  Calvert Cantor, CNM 03/04/2020, 9:27 PM

## 2020-04-10 ENCOUNTER — Telehealth (INDEPENDENT_AMBULATORY_CARE_PROVIDER_SITE_OTHER): Payer: Medicaid Other

## 2020-04-10 DIAGNOSIS — J45909 Unspecified asthma, uncomplicated: Secondary | ICD-10-CM

## 2020-04-10 DIAGNOSIS — Z8632 Personal history of gestational diabetes: Secondary | ICD-10-CM

## 2020-04-10 DIAGNOSIS — Z3480 Encounter for supervision of other normal pregnancy, unspecified trimester: Secondary | ICD-10-CM

## 2020-04-10 DIAGNOSIS — O0990 Supervision of high risk pregnancy, unspecified, unspecified trimester: Secondary | ICD-10-CM | POA: Insufficient documentation

## 2020-04-10 HISTORY — DX: Personal history of gestational diabetes: Z86.32

## 2020-04-10 MED ORDER — BLOOD PRESSURE MONITORING DEVI: 1.0000 | 0 refills | Status: DC

## 2020-04-10 NOTE — Progress Notes (Signed)
New OB Intake  I connected with  Claudia Lambert on 04/10/20 at  1:15 PM EST by MyChart and verified that I am speaking with the correct person using two identifiers. Nurse is located at [facility] and pt is located at home.  I discussed the limitations, risks, security and privacy concerns of performing an evaluation and management service by telephone and the availability of in person appointments. I also discussed with the patient that there may be a patient responsible charge related to this service. The patient expressed understanding and agreed to proceed.  I explained I am completing New OB Intake today. We discussed her EDD of 10/07/20 that is based on LMP of 01/01/20. Pt is G4/P1. I reviewed her allergies, medications, Medical/Surgical/OB history, and appropriate screenings. I informed her of [facility] services. Based on history, this is a/an uncomplicated pregnancy.  Concerns addressed today  MyChart/Babyscripts MyChart access verified. I explained pt will have some visits in office and some virtually. Babyscripts instructions given. Account successfully created and app downloaded.  Blood Pressure Cuff Blood pressure cuff ordered for patient to pick-up from [employer]. Explained after first prenatal appt pt will check weekly and document in Babyscripts.  Anatomy US Explained first scheduled [place] will be around 19 weeks. Anatomy US scheduled for 05/13/20 at 1:45p. Pt notified to arrive at 1:45p.  Labs Discussed Avelina Laine genetic screening with patient. Would like both Panorama and Horizon drawn at new OB visit. Routine prenatal labs needed.  First visit review I reviewed new OB appt with pt. I explained she will have a pelvic exam, ob bloodwork with genetic screening, and PAP smear. Explained pt will be seen by Mathews Robinsons at first visit; encounter routed to appropriate provider.  Henrietta Dine, CMA 04/10/2020  1:15 PM

## 2020-04-16 ENCOUNTER — Other Ambulatory Visit (HOSPITAL_COMMUNITY)
Admission: RE | Admit: 2020-04-16 | Discharge: 2020-04-16 | Disposition: A | Discharge status: Home / Self Care | Payer: Medicaid Other | Source: Ambulatory Visit | Attending: Advanced Practice Midwife | Admitting: Advanced Practice Midwife

## 2020-04-16 ENCOUNTER — Encounter: Payer: Self-pay | Admitting: Advanced Practice Midwife

## 2020-04-16 ENCOUNTER — Other Ambulatory Visit: Payer: Self-pay

## 2020-04-16 ENCOUNTER — Ambulatory Visit (INDEPENDENT_AMBULATORY_CARE_PROVIDER_SITE_OTHER): Payer: Medicaid Other | Admitting: Advanced Practice Midwife

## 2020-04-16 DIAGNOSIS — Z3480 Encounter for supervision of other normal pregnancy, unspecified trimester: Secondary | ICD-10-CM | POA: Insufficient documentation

## 2020-04-16 LAB — POCT URINALYSIS DIP (DEVICE)
Bilirubin Urine: NEGATIVE
Glucose, UA: NEGATIVE mg/dL
Hgb urine dipstick: NEGATIVE
Ketones, ur: NEGATIVE mg/dL
Leukocytes,Ua: NEGATIVE
Nitrite: NEGATIVE
Protein, ur: NEGATIVE mg/dL
Specific Gravity, Urine: >=1.03 (ref 1.005–1.030)
Urobilinogen, UA: 0.2 mg/dL (ref 0.0–1.0)
pH: 6 (ref 5.0–8.0)

## 2020-04-16 NOTE — Progress Notes (Signed)
Subjective:   Claudia Lambert is a 35 y.o. F7C9449 at [redacted]w[redacted]d by LMP being seen today for her first obstetrical visit.  Her obstetrical history is significant for prio c-section x 1 for placenta previa . Patient does intend to breast feed. Pregnancy history fully reviewed.  Patient reports cramping and pelivc pressure .  HISTORY: OB History  Gravida Para Term Preterm AB Living  4 1 1  0 2 1  SAB IAB Ectopic Multiple Live Births  1 1 0 0 1    # Outcome Date GA Lbr Len/2nd Weight Sex Delivery Anes PTL Lv  4 Current           3 Term 09/19/16 [redacted]w[redacted]d  6 lb 14 oz (3.118 kg) M CS-Unspec  N LIV     Complications: Placenta Previa     Name: Claudia Lambert  2 IAB           1 SAB             Last pap smear was done 2021 (in 2022) and was need to get ROI to get records from prior provider   Past Medical History:  Diagnosis Date  . Asthma   . Eczema   . Gestational diabetes   . Infection    UTI   Past Surgical History:  Procedure Laterality Date  . CESAREAN SECTION     Placenta Previa   Family History  Problem Relation Age of Onset  . Fibroids Mother    Social History   Tobacco Use  . Smoking status: Never Smoker  . Smokeless tobacco: Never Used  Vaping Use  . Vaping Use: Never used  Substance Use Topics  . Alcohol use: Not Currently    Comment: occ, "not really a drinker"  . Drug use: Not Currently    Types: Marijuana    Comment: last was Sept 2021   No Known Allergies Current Outpatient Medications on File Prior to Visit  Medication Sig Dispense Refill  . Prenatal Vit-Fe Fumarate-FA (PRENATAL VITAMIN PO) Take by mouth.    . Blood Pressure Monitoring DEVI 1 each by Does not apply route once a week. (Patient not taking: Reported on 04/16/2020) 1 each 0  . famotidine (PEPCID) 20 MG tablet Take 1 tablet (20 mg total) by mouth daily. (Patient not taking: No sig reported) 30 tablet 1  . metoCLOPramide (REGLAN) 10 MG tablet Take 1 tablet (10 mg total) by mouth 3 (three) times daily with  meals. (Patient not taking: No sig reported) 90 tablet 1   No current facility-administered medications on file prior to visit.    Review of Systems Pertinent items noted in HPI and remainder of comprehensive ROS otherwise negative.  Exam   Vitals:   04/16/20 1528  BP: 120/83  Pulse: 88  Weight: 153 lb 11.2 oz (69.7 kg)   Fetal Heart Rate (bpm): 145  Uterus:     Pelvic Exam: Perineum: no hemorrhoids, normal perineum   Vulva: normal external genitalia, no lesions   Vagina:  normal mucosa, normal discharge   Cervix: no lesions and normal, pap smear done.    Adnexa: normal adnexa and no mass, fullness, tenderness   Bony Pelvis: average  System: General: well-developed, well-nourished female in no acute distress   Breast:     Skin: normal coloration and turgor, no rashes   Neurologic: oriented, normal, negative, normal mood   Extremities: normal strength, tone, and muscle mass, ROM of all joints is normal   HEENT PERRLA, extraocular movement intact and  sclera clear, anicteric   Mouth/Teeth mucous membranes moist, pharynx normal without lesions and dental hygiene good   Neck supple and no masses   Cardiovascular: regular rate and rhythm   Respiratory:  no respiratory distress, normal breath sounds   Abdomen: soft, non-tender; bowel sounds normal; no masses,  no organomegaly    Assessment:   Pregnancy: G5O0370 Patient Active Problem List   Diagnosis Date Noted  . Supervision of other normal pregnancy, antepartum 04/10/2020  . History of diet controlled gestational diabetes mellitus (GDM) 04/10/2020  . Asthma 04/10/2020  . Pregnant 03/04/2020  . Marijuana use 03/04/2020     Plan:  1. Supervision of other normal pregnancy, antepartum - GC/Chlamydia probe amp ([place])not at [facility] - Genetic Screening - CBC/D/Plt+RPR+Rh+ABO+Rub Ab... - CHL AMB BABYSCRIPTS SCHEDULE OPTIMIZATION - Hemoglobin A1c - Culture, OB Urine - AFP, Serum, Open Spina Bifida  2. Possible heart  problem - Patient states that when she was pregnant with her last pregnancy she saw a cardiologist and had to wear a heart monitor. She is unsure what the outcome of that was, but reports that they told her that she may not be able to push during delivery. In the end she thinks they said everything was norma. Will get ROI for cardiologist in [place] that she saw.  Dr. Dorise Bullion  [phone]   Initial labs drawn. Continue prenatal vitamins. Genetic Screening discussed, AFP and NIPS: requested. Ultrasound discussed; fetal anatomic survey: requested. Problem list reviewed and updated. The nature of [facility] with multiple MDs and other Advanced Practice Providers was explained to patient; also emphasized that residents, students are part of our team. Routine obstetric precautions reviewed. 50% of 45 min visit spent in counseling and coordination of care.  4 weeks for virtual visit    Thressa Sheller DNP, CNM  04/16/20  4:30 PM

## 2020-04-16 NOTE — Patient Instructions (Signed)
Blood pressure cuff [facility]  [address] [place], [place] [address]  Phone: [phone]

## 2020-04-17 LAB — GC/CHLAMYDIA PROBE AMP (~~LOC~~) NOT AT ARMC
Chlamydia: NEGATIVE
Comment: NEGATIVE
Comment: NORMAL
Neisseria Gonorrhea: NEGATIVE

## 2020-04-18 LAB — AFP, SERUM, OPEN SPINA BIFIDA
AFP MoM: 1.23
AFP Value: 39.9 ng/mL
Gest. Age on Collection Date: 15.1 weeks
Maternal Age At EDD: 35 yr
OSBR Risk 1 IN: 10000
Test Results:: NEGATIVE
Weight: 154 [lb_av]

## 2020-04-18 LAB — CBC/D/PLT+RPR+RH+ABO+RUB AB...
Antibody Screen: NEGATIVE
Basophils Absolute: 0.1 10*3/uL (ref 0.0–0.2)
Basos: 1 %
EOS (ABSOLUTE): 0.4 10*3/uL (ref 0.0–0.4)
Eos: 4 %
HCV Ab: <0.1 s/co ratio (ref 0.0–0.9)
HIV Screen 4th Generation wRfx: NONREACTIVE
Hematocrit: 36.9 % (ref 34.0–46.6)
Hemoglobin: 13.1 g/dL (ref 11.1–15.9)
Hepatitis B Surface Ag: NEGATIVE
Immature Grans (Abs): 0.1 10*3/uL (ref 0.0–0.1)
Immature Granulocytes: 1 %
Lymphocytes Absolute: 2.2 10*3/uL (ref 0.7–3.1)
Lymphs: 19 %
MCH: 32.6 pg (ref 26.6–33.0)
MCHC: 35.5 g/dL (ref 31.5–35.7)
MCV: 92 fL (ref 79–97)
Monocytes Absolute: 0.9 10*3/uL (ref 0.1–0.9)
Monocytes: 8 %
Neutrophils Absolute: 7.8 10*3/uL — ABNORMAL HIGH (ref 1.4–7.0)
Neutrophils: 67 %
Platelets: 346 10*3/uL (ref 150–450)
RBC: 4.02 x10E6/uL (ref 3.77–5.28)
RDW: 12.8 % (ref 11.7–15.4)
RPR Ser Ql: NONREACTIVE
Rh Factor: POSITIVE
Rubella Antibodies, IGG: 4.49 index (ref >0.99)
WBC: 11.4 10*3/uL — ABNORMAL HIGH (ref 3.4–10.8)

## 2020-04-18 LAB — HEMOGLOBIN A1C
Est. average glucose Bld gHb Est-mCnc: 100 mg/dL
Hgb A1c MFr Bld: 5.1 % (ref 4.8–5.6)

## 2020-04-18 LAB — CULTURE, OB URINE

## 2020-04-18 LAB — URINE CULTURE, OB REFLEX

## 2020-04-18 LAB — HCV INTERPRETATION

## 2020-04-23 ENCOUNTER — Encounter: Payer: Self-pay | Admitting: *Deleted

## 2020-04-23 ENCOUNTER — Other Ambulatory Visit: Payer: Self-pay | Admitting: *Deleted

## 2020-04-23 DIAGNOSIS — N898 Other specified noninflammatory disorders of vagina: Secondary | ICD-10-CM

## 2020-04-23 MED ORDER — TERCONAZOLE 0.8 % VA CREA: 1.0000 | TOPICAL_CREAM | Freq: Every day | VAGINAL | 0 refills | Status: AC

## 2020-05-13 ENCOUNTER — Ambulatory Visit: Payer: Medicaid Other

## 2020-05-14 ENCOUNTER — Telehealth (INDEPENDENT_AMBULATORY_CARE_PROVIDER_SITE_OTHER): Payer: Medicaid Other | Admitting: Advanced Practice Midwife

## 2020-05-14 VITALS — BP 134/75 | HR 84

## 2020-05-14 DIAGNOSIS — J45909 Unspecified asthma, uncomplicated: Secondary | ICD-10-CM

## 2020-05-14 DIAGNOSIS — S334XXA Traumatic rupture of symphysis pubis, initial encounter: Secondary | ICD-10-CM

## 2020-05-14 DIAGNOSIS — Z3A19 19 weeks gestation of pregnancy: Secondary | ICD-10-CM

## 2020-05-14 DIAGNOSIS — O99512 Diseases of the respiratory system complicating pregnancy, second trimester: Secondary | ICD-10-CM

## 2020-05-14 DIAGNOSIS — Z3480 Encounter for supervision of other normal pregnancy, unspecified trimester: Secondary | ICD-10-CM

## 2020-05-14 DIAGNOSIS — O26712 Subluxation of symphysis (pubis) in pregnancy, second trimester: Secondary | ICD-10-CM

## 2020-05-14 NOTE — Progress Notes (Signed)
    TELEHEALTH OBSTETRICS VISIT ENCOUNTER NOTE  Provider location: [facility] for [employer] [facility] [employer]   I connected with Claudia Lambert on 05/14/20 at  3:35 PM EST by MyChart Video at home and verified that I am speaking with the correct person using two identifiers.   Patient is at home. Provider is in the office    I discussed the limitations, risks, security and privacy concerns of performing an evaluation and management service by telephone and the availability of in person appointments. I also discussed with the patient that there may be a patient responsible charge related to this service. The patient expressed understanding and agreed to proceed.  Subjective:  Claudia Lambert is a 35 y.o. N6E9528 at [redacted]w[redacted]d being followed for ongoing prenatal care.  She is currently monitored for the following issues for this low-risk pregnancy and has Pregnant; Marijuana use; Supervision of other normal pregnancy, antepartum; History of diet controlled gestational diabetes mellitus (GDM); and Asthma on their problem list.  Patient reports numbness in hands , pubic bone/pelvic pain and popping. Reports fetal movement. Denies any contractions, bleeding or leaking of fluid.   The following portions of the patient's history were reviewed and updated as appropriate: allergies, current medications, past family history, past medical history, past social history, past surgical history and problem list.   Objective:  Blood pressure 134/75, pulse 84, last menstrual period 01/01/2020.   General:  Alert, oriented and cooperative.   Mental Status: Normal mood and affect perceived. Normal judgment and thought content.  Rest of physical exam deferred due to type of encounter  Assessment and Plan:  Pregnancy: G4P1021 at [redacted]w[redacted]d 1. Supervision of other normal pregnancy, antepartum  2. Mild asthma without complication, unspecified whether persistent  3. [redacted] weeks gestation of pregnancy - routine care    4. Pubic Symphysis disruption - Referral PT    Preterm labor symptoms and general obstetric precautions including but not limited to vaginal bleeding, contractions, leaking of fluid and fetal movement were reviewed in detail with the patient.  I discussed the assessment and treatment plan with the patient. The patient was provided an opportunity to ask questions and all were answered. The patient agreed with the plan and demonstrated an understanding of the instructions. The patient was advised to call back or seek an in-person office evaluation/go to MAU at [facility] for any urgent or concerning symptoms. Please refer to After Visit Summary for other counseling recommendations.   I provided 12 minutes of non-face-to-face time during this encounter.  Return in about 4 weeks (around 06/11/2020) for virtual visit .  Future Appointments  Date Time Provider Department Center  05/20/2020  1:45 PM [facility] US5 [facility]    Thressa Sheller DNP, CNM  05/14/20  4:02 PM

## 2020-05-14 NOTE — Progress Notes (Signed)
I connected with  Claudia Lambert on 05/14/20 at 1551 by MyChart and verified that I am speaking with the correct person using two identifiers.   I discussed the limitations, risks, security and privacy concerns of performing an evaluation and management service by telephone and the availability of in person appointments. I also discussed with the patient that there may be a patient responsible charge related to this service. The patient expressed understanding and agreed to proceed.  Marjo Bicker, RN 05/14/2020  3:51 PM

## 2020-05-20 ENCOUNTER — Ambulatory Visit: Payer: Medicaid Other | Attending: Advanced Practice Midwife

## 2020-05-20 ENCOUNTER — Other Ambulatory Visit: Payer: Self-pay

## 2020-05-20 DIAGNOSIS — Z3480 Encounter for supervision of other normal pregnancy, unspecified trimester: Secondary | ICD-10-CM | POA: Insufficient documentation

## 2020-05-21 ENCOUNTER — Other Ambulatory Visit: Payer: Self-pay | Admitting: *Deleted

## 2020-05-21 DIAGNOSIS — Z3492 Encounter for supervision of normal pregnancy, unspecified, second trimester: Secondary | ICD-10-CM

## 2020-05-30 ENCOUNTER — Encounter: Payer: Self-pay | Admitting: *Deleted

## 2020-06-06 ENCOUNTER — Encounter: Payer: Self-pay | Admitting: *Deleted

## 2020-06-10 ENCOUNTER — Other Ambulatory Visit: Payer: Self-pay

## 2020-06-10 ENCOUNTER — Other Ambulatory Visit: Payer: Self-pay | Admitting: Obstetrics

## 2020-06-10 ENCOUNTER — Ambulatory Visit: Payer: Medicaid Other | Attending: Obstetrics

## 2020-06-10 ENCOUNTER — Ambulatory Visit: Payer: Medicaid Other | Admitting: *Deleted

## 2020-06-10 ENCOUNTER — Encounter: Payer: Self-pay | Admitting: *Deleted

## 2020-06-10 DIAGNOSIS — Z3480 Encounter for supervision of other normal pregnancy, unspecified trimester: Secondary | ICD-10-CM

## 2020-06-10 DIAGNOSIS — Z3492 Encounter for supervision of normal pregnancy, unspecified, second trimester: Secondary | ICD-10-CM | POA: Insufficient documentation

## 2020-06-10 DIAGNOSIS — J45909 Unspecified asthma, uncomplicated: Secondary | ICD-10-CM

## 2020-06-11 ENCOUNTER — Other Ambulatory Visit: Payer: Self-pay | Admitting: *Deleted

## 2020-06-11 ENCOUNTER — Telehealth (INDEPENDENT_AMBULATORY_CARE_PROVIDER_SITE_OTHER): Payer: Medicaid Other | Admitting: Obstetrics and Gynecology

## 2020-06-11 ENCOUNTER — Encounter: Payer: Self-pay | Admitting: Obstetrics and Gynecology

## 2020-06-11 VITALS — BP 124/79 | HR 89

## 2020-06-11 DIAGNOSIS — O0990 Supervision of high risk pregnancy, unspecified, unspecified trimester: Secondary | ICD-10-CM

## 2020-06-11 DIAGNOSIS — R931 Abnormal findings on diagnostic imaging of heart and coronary circulation: Secondary | ICD-10-CM

## 2020-06-11 DIAGNOSIS — O99512 Diseases of the respiratory system complicating pregnancy, second trimester: Secondary | ICD-10-CM | POA: Diagnosis not present

## 2020-06-11 DIAGNOSIS — Z98891 History of uterine scar from previous surgery: Secondary | ICD-10-CM | POA: Insufficient documentation

## 2020-06-11 DIAGNOSIS — Z3A23 23 weeks gestation of pregnancy: Secondary | ICD-10-CM

## 2020-06-11 DIAGNOSIS — O365920 Maternal care for other known or suspected poor fetal growth, second trimester, not applicable or unspecified: Secondary | ICD-10-CM | POA: Insufficient documentation

## 2020-06-11 DIAGNOSIS — J45909 Unspecified asthma, uncomplicated: Secondary | ICD-10-CM

## 2020-06-11 DIAGNOSIS — O34219 Maternal care for unspecified type scar from previous cesarean delivery: Secondary | ICD-10-CM

## 2020-06-11 DIAGNOSIS — O0992 Supervision of high risk pregnancy, unspecified, second trimester: Secondary | ICD-10-CM | POA: Diagnosis not present

## 2020-06-11 DIAGNOSIS — Z8632 Personal history of gestational diabetes: Secondary | ICD-10-CM

## 2020-06-11 NOTE — Progress Notes (Signed)
TELEHEALTH VIRTUAL OBSTETRICS VISIT ENCOUNTER NOTE  Clinic: [facility]  I connected with Claudia Lambert on 06/11/20 at  3:55 PM EST by telephone at home and verified that I am speaking with the correct person using two identifiers.   I discussed the limitations, risks, security and privacy concerns of performing an evaluation and management service by telephone and the availability of in person appointments. I also discussed with the patient that there may be a patient responsible charge related to this service. The patient expressed understanding and agreed to proceed.  Subjective:  Claudia Lambert is a 35 y.o. G4P1021 at 64w1dbeing followed for ongoing prenatal care.  She is currently monitored for the following issues for this high-risk pregnancy and has Marijuana use; Supervision of high risk pregnancy, antepartum; History of diet controlled gestational diabetes mellitus (GDM); Asthma; History of cesarean delivery; Poor fetal growth affecting management of mother in second trimester; and Abnormal echocardiogram on their problem list.  Patient reports no complaints. Reports fetal movement. Denies any contractions, bleeding or leaking of fluid.   The following portions of the patient's history were reviewed and updated as appropriate: allergies, current medications, past family history, past medical history, past social history, past surgical history and problem list.   Objective:   Vitals:   06/11/20 1619  BP: 124/79  Pulse: 89    Babyscripts Data Reviewed: yes  General:  Alert, oriented and cooperative.   Mental Status: Normal mood and affect perceived. Normal judgment and thought content.  Rest of physical exam deferred due to type of encounter  Assessment and Plan:  Pregnancy: G4P1021 at 276w1d. Supervision of high risk pregnancy, antepartum Routine care. Outside records not obtained but I was able to find them via care everywhere (see below) -  Ambulatory referral to Cardiology  2. Mild asthma without complication, unspecified whether persistent No issues on no meds  3. History of cesarean delivery S/p 09/2016 pLTCS at 37/0 for low lying placentaj  4. Poor fetal growth affecting management of mother in second trimester, single or unspecified fetus Dx at her last MFM u/s on 3/8: efw 8%, 466gm, ac 19%, afi normal, normal UA dopplers. She has rpt growth and dopplers on 3/28. Patient does not smoke  5. History of diet controlled gestational diabetes mellitus (GDM) Normal early a1c. Follow up 2h GTT at 24-28wks  6. Abnormal echocardiogram She denies any cardiac s/s. No cardiology records available from [facility] but I was able to find her echo on care everywhere.  Patient states that she was to do [place] cardi[place]. The last note I see is from August 02 2016 where she had an echo and no progress note seen: [facility] ECHO - 08/02/2016 11:00 AM EDT  This result has an attachment that is not available.   No valve disease  Normal LV systolic function  LV ejection fraction is 60%  Normal RV systolic function.  No hypertrophic cardiomyopathy.  Moderate size inter-atrial septal (IAS) aneurysm present.  Left Ventricle Normal ventricular size and wall thickness. No false tendon present.  No myocardial specular pattern present. No accessory papillary muscle. No apical trabeculation present. No hypertrophic cardiomyopathy present. Normal ejection fraction. Left ventricular ejection fraction is 60% using method of discs (modified Simpson's rule). Normal segmental wall motion and normal septal wall motion. Normal LV diastolic function.  Right Ventricle Normal ventricular size, wall thickness and systolic function.  Left Atrium  Normal atrial size.  Right Atrium Normal atrial size.  IVC/SVC IVC is normal in size.  Mitral Valve Normal valve structure. No mitral  annular calcification (MAC).  Calcified papillary muscle not present. No systolic anterior motion (SAM) present (see LV). No regurgitation. No stenosis.  Tricuspid Valve Normal valve structure. No stenosis. No regurgitation.  No pulmonary hypertension.  Aortic Valve Normal valve structure.  No stenosis. No regurgitation.  Pulmonic Valve Normal valve structure. No stenosis. No regurgitation.  Pericardium No pericardial effusion.  Great Arteries Aortic root normal in size  Congenital Moderate size inter-atrial septal (IAS) aneurysm present. No atrial septal defect (ASD).  Procedure Complete study. Adequate study quality.  Wall Scoring Score Index: 1.000 Percent Normal: 100.0% The left ventricular wall motion is normal.    It looks like they referred her back to cardiology but I don't see any additional notes and the only mention I see is on her admission H&P for her c-section pm 09/21/2016 where it states "cardiology consult later June". I don't see any actual cardiology notes.   ASAP referral to cardiology  - Ambulatory referral to Cardiology  Preterm labor symptoms and general obstetric precautions including but not limited to vaginal bleeding, contractions, leaking of fluid and fetal movement were reviewed in detail with the patient.  I discussed the assessment and treatment plan with the patient. The patient was provided an opportunity to ask questions and all were answered. The patient agreed with the plan and demonstrated an understanding of the instructions. The patient was advised to call back or seek an in-person office evaluation/go to [facility] at [facility] for any urgent or concerning symptoms. Please refer to After Visit Summary for other counseling recommendations.   I provided 20 minutes of non-face-to-face time during this encounter. The visit was conducted via MyChart-medicine  Return in about 12 days (around 06/23/2020) for in person, md  visit.  Future Appointments  Date Time Provider [place]  06/23/2020  7:30 AM [facility] NURSE [facility]  06/23/2020  7:45 AM [facility] US5 [facility]  06/30/2020  7:30 AM [facility] NURSE [facility]  06/30/2020  7:45 AM [facility] US5 [facility]  07/02/2020  2:00 PM Monico Hoar, PT [facility]    Aletha Halim, MD [facility]

## 2020-06-11 NOTE — Progress Notes (Signed)
I connected with  Mane Hardwick on 06/11/20 at 1617 by telephone and verified that I am speaking with the correct person using two identifiers.   I discussed the limitations, risks, security and privacy concerns of performing an evaluation and management service by telephone and the availability of in person appointments. I also discussed with the patient that there may be a patient responsible charge related to this service. The patient expressed understanding and agreed to proceed.  Marjo Bicker, RN 06/11/2020  4:17 PM

## 2020-06-12 DIAGNOSIS — R931 Abnormal findings on diagnostic imaging of heart and coronary circulation: Secondary | ICD-10-CM | POA: Insufficient documentation

## 2020-06-22 NOTE — Progress Notes (Signed)
Cardiology Office Note:   Date:  06/23/2020  NAME:  Claudia Lambert    MRN: [id number] DOB:  [birth year]   PCP:  Patient, No Pcp Per  Cardiologist:  No primary care provider on file.  Electrophysiologist:  None   Referring MD: [place] Bing, MD   Chief Complaint  Patient presents with  . New Patient (Initial Visit)  . abnormal echo   History of Present Illness:   Claudia Lambert is a 35 y.o. female with a hx of gestational diabetes who is being seen today for the evaluation of interatrial septal aneurysm at the request of [place] Bing, MD. Seen by [facility] and had echo in 2018 in [place] that showed interatrial septal aneurysm.  She reports she had a work-up in [place] in 2018.  She was pregnant and had asthma.  She had an echocardiogram as part of that work-up.  Apparently she was found to have an atrial septal aneurysm.  She was told she needed follow-up.  She also had tachycardia.  She wore a monitor that was unremarkable for any arrhythmias.  She reports she has had no major symptoms.  She is currently [redacted] weeks pregnant.  She will have a girl.  This will be her second child.  She reports she can get short of breath with exertion.  Not unexpected for pregnancy.  She is tachycardic with a heart rate of 110 on examination.  EKG demonstrates sinus tachycardia.  No recent TSH.  She needs full labs.  We need to make sure her thyroid is okay.  Despite her tachycardia she reports no symptoms.  She denies any chest pain or significant symptoms.  She has no lower extremity edema.  She is a never smoker.  She does not drink alcohol or use drugs.  There is no family history of heart disease.  This is her second pregnancy.  Things are going well.  She denies any symptoms of chest pain.  She mainly gets short of breath with exertion which is expected.  Her cardiovascular examination is benign.  Problem List 1. Interatrial septal aneurysm  -Echo 2018 [facility])  Past Medical History: Past Medical History:   Diagnosis Date  . Asthma   . Eczema   . Infection    UTI    Past Surgical History: Past Surgical History:  Procedure Laterality Date  . CESAREAN SECTION     Placenta Previa    Current Medications: Current Meds  Medication Sig  . Blood Pressure Monitoring DEVI 1 each by Does not apply route once a week.  . Prenatal Vit-Fe Fumarate-FA (PRENATAL VITAMIN PO) Take by mouth.  . [DISCONTINUED] famotidine (PEPCID) 20 MG tablet Take 1 tablet (20 mg total) by mouth daily.  . [DISCONTINUED] metoCLOPramide (REGLAN) 10 MG tablet Take 1 tablet (10 mg total) by mouth 3 (three) times daily with meals.     Allergies:    Patient has no known allergies.   Social History: Social History   Socioeconomic History  . Marital status: Single    Spouse name: Not on file  . Number of children: 1  . Years of education: Not on file  . Highest education level: Not on file  Occupational History  . Occupation: works for [employer]  . Smoking status: Never Smoker  . Smokeless tobacco: Never Used  Vaping Use  . Vaping Use: Never used  Substance and Sexual Activity  . Alcohol use: Not Currently    Comment: occ, "not really a drinker"  .  Drug use: Not Currently    Types: Marijuana    Comment: last was Sept 2021  . Sexual activity: Yes    Birth control/protection: None  Other Topics Concern  . Not on file  Social History Narrative  . Not on file   Social Determinants of Health   Financial Resource Strain: Not on file  Food Insecurity: No Food Insecurity  . Worried About Programme researcher, broadcasting/film/video in the Last Year: Never true  . Ran Out of Food in the Last Year: Never true  Transportation Needs: No Transportation Needs  . Lack of Transportation (Medical): No  . Lack of Transportation (Non-Medical): No  Physical Activity: Not on file  Stress: Not on file  Social Connections: Not on file     Family History: The patient's family history includes Fibroids in her mother.  ROS:   All  other ROS reviewed and negative. Pertinent positives noted in the HPI.     EKGs/Labs/Other Studies Reviewed:   The following studies were personally reviewed by me today:  EKG:  EKG is ordered today.  The ekg ordered today demonstrates sinus tachycardia heart rate 110, no acute ischemic changes, no evidence of infarction, and was personally reviewed by me.   TTE 08/02/2016 [facility]) This result has an attachment that is not available.   No valve disease   Normal LV systolic function   LV ejection fraction is 60%   Normal RV systolic function.   No hypertrophic cardiomyopathy.   Moderate size inter-atrial septal (IAS) aneurysm present.    Recent Labs: 02/22/2020: ALT 21; BUN 9; Creatinine, Ser 0.76; Potassium 3.8; Sodium 134 04/16/2020: Hemoglobin 13.1; Platelets 346   Recent Lipid Panel No results found for: CHOL, TRIG, HDL, CHOLHDL, VLDL, LDLCALC, LDLDIRECT  Physical Exam:   VS:  BP 100/66 (BP Location: Left Arm, Patient Position: Sitting, Cuff Size: Normal)   Pulse (!) 110   Ht 5\' 6"  (1.676 m)   Wt 170 lb (77.1 kg)   LMP 01/01/2020   BMI 27.44 kg/m    Wt Readings from Last 3 Encounters:  06/23/20 170 lb (77.1 kg)  04/16/20 153 lb 11.2 oz (69.7 kg)  02/22/20 143 lb 8 oz (65.1 kg)    General: Well nourished, well developed, in no acute distress Head: Atraumatic, normal size  Eyes: PEERLA, EOMI  Neck: Supple, no JVD Endocrine: No thryomegaly Cardiac: Normal S1, S2; RRR; no murmurs, rubs, or gallops Lungs: Clear to auscultation bilaterally, no wheezing, rhonchi or rales  Abd: Soft, nontender, no hepatomegaly  Ext: No edema, pulses 2+ Musculoskeletal: No deformities, BUE and BLE strength normal and equal Skin: Warm and dry, no rashes   Neuro: Alert and oriented to person, place, time, and situation, CNII-XII grossly intact, no focal deficits  Psych: Normal mood and affect   ASSESSMENT:   [name] is a 35 y.o. female who presents for the following: 1. Atrial  septal aneurysm   2. Tachycardia     PLAN:   1. Atrial septal aneurysm -Echocardiogram 2018 demonstrated an inter atrial septal aneurysm.  She has no symptoms from this nor should she.  This is a relatively benign entity.  This will not affect her pregnancy in any way.  There is nothing that needs to be done about this.  I would recommend she repeat an echocardiogram.  We will get a better idea of how large the aneurysm is.  Again this really has no bearing on her longevity or life.  This clearly has no  bearing on her pregnancy.  I will notify her of follow-up based on the results of that.  She has no strokelike symptoms or new concerns for that.  She may indeed have a PFO however again this is not a major problem for her.  She had normal LV and RV function on echocardiogram.  I suspect she again will have normal function.  2. Tachycardia -Sinus tachycardia today in office.  No recent TSH.  She needs a CBC, TSH and BMP.  We will arrange this in the next day or 2.  I have recommended adequate hydration and reduce caffeine consumption.  She should also exercise lightly.  She has no symptoms from this which is reassuring.  Disposition: Return if symptoms worsen or fail to improve.  Medication Adjustments/Labs and Tests Ordered: Current medicines are reviewed at length with the patient today.  Concerns regarding medicines are outlined above.  Orders Placed This Encounter  Procedures  . CBC  . Basic metabolic panel  . TSH  . EKG 12-Lead  . ECHOCARDIOGRAM COMPLETE   No orders of the defined types were placed in this encounter.   Patient Instructions  Medication Instructions:  The current medical regimen is effective;  continue present plan and medications.  *If you need a refill on your cardiac medications before your next appointment, please call your pharmacy*   Lab Work: CBC, TSH, BMET - please come back for these labs, no appointment needed  If you have labs (blood work) drawn today  and your tests are completely normal, you will receive your results only by: Marland Kitchen MyChart Message (if you have MyChart) OR . A paper copy in the mail If you have any lab test that is abnormal or we need to change your treatment, we will call you to review the results.   Testing/Procedures: Echocardiogram - Your physician has requested that you have an echocardiogram. Echocardiography is a painless test that uses sound waves to create images of your heart. It provides your doctor with information about the size and shape of your heart and how well your heart's chambers and valves are working. This procedure takes approximately one hour. There are no restrictions for this procedure. This will be performed at our [facility] Main location - [address], [address].    Follow-Up: At [facility], you and your health needs are our priority.  As part of our continuing mission to provide you with exceptional heart care, we have created designated Provider Care Teams.  These Care Teams include your primary Cardiologist (physician) and Advanced Practice Providers (APPs -  Physician Assistants and Nurse Practitioners) who all work together to provide you with the care you need, when you need it.  We recommend signing up for the patient portal called "MyChart".  Sign up information is provided on this After Visit Summary.  MyChart is used to connect with patients for Virtual Visits (Telemedicine).  Patients are able to view lab/test results, encounter notes, upcoming appointments, etc.  Non-urgent messages can be sent to your provider as well.   To learn more about what you can do with MyChart, go to [email or web address].    Your next appointment:   As needed  The format for your next appointment:   In Person  Provider:   Lennie Odor, MD       Signed, Lenna Gilford. Flora Lipps, MD, [facility]  [facility]  [address], [address] [place], [place] [address] [phone]  06/23/2020 4:47 PM

## 2020-06-23 ENCOUNTER — Ambulatory Visit (INDEPENDENT_AMBULATORY_CARE_PROVIDER_SITE_OTHER): Payer: Medicaid Other | Admitting: Cardiovascular Disease

## 2020-06-23 ENCOUNTER — Other Ambulatory Visit: Payer: Self-pay

## 2020-06-23 ENCOUNTER — Encounter: Payer: Self-pay | Admitting: *Deleted

## 2020-06-23 ENCOUNTER — Ambulatory Visit: Payer: Medicaid Other | Admitting: *Deleted

## 2020-06-23 ENCOUNTER — Ambulatory Visit: Payer: Medicaid Other | Attending: Obstetrics

## 2020-06-23 ENCOUNTER — Encounter: Payer: Self-pay | Admitting: Cardiovascular Disease

## 2020-06-23 VITALS — BP 100/66 | HR 110 | Ht 66.0 in | Wt 170.0 lb

## 2020-06-23 DIAGNOSIS — Z3A24 24 weeks gestation of pregnancy: Secondary | ICD-10-CM | POA: Diagnosis not present

## 2020-06-23 DIAGNOSIS — O365920 Maternal care for other known or suspected poor fetal growth, second trimester, not applicable or unspecified: Secondary | ICD-10-CM

## 2020-06-23 DIAGNOSIS — R000 Tachycardia, unspecified: Secondary | ICD-10-CM | POA: Diagnosis not present

## 2020-06-23 DIAGNOSIS — I253 Aneurysm of heart: Secondary | ICD-10-CM | POA: Diagnosis not present

## 2020-06-23 DIAGNOSIS — O321XX0 Maternal care for breech presentation, not applicable or unspecified: Secondary | ICD-10-CM | POA: Diagnosis not present

## 2020-06-23 DIAGNOSIS — Z8632 Personal history of gestational diabetes: Secondary | ICD-10-CM

## 2020-06-23 DIAGNOSIS — O0990 Supervision of high risk pregnancy, unspecified, unspecified trimester: Secondary | ICD-10-CM | POA: Insufficient documentation

## 2020-06-23 NOTE — Patient Instructions (Signed)
Medication Instructions:  The current medical regimen is effective;  continue present plan and medications.  *If you need a refill on your cardiac medications before your next appointment, please call your pharmacy*   Lab Work: CBC, TSH, BMET - please come back for these labs, no appointment needed  If you have labs (blood work) drawn today and your tests are completely normal, you will receive your results only by: Marland Kitchen MyChart Message (if you have MyChart) OR . A paper copy in the mail If you have any lab test that is abnormal or we need to change your treatment, we will call you to review the results.   Testing/Procedures: Echocardiogram - Your physician has requested that you have an echocardiogram. Echocardiography is a painless test that uses sound waves to create images of your heart. It provides your doctor with information about the size and shape of your heart and how well your heart's chambers and valves are working. This procedure takes approximately one hour. There are no restrictions for this procedure. This will be performed at our [facility] location - [address].    Follow-Up: At [facility], you and your health needs are our priority.  As part of our continuing mission to provide you with exceptional heart care, we have created designated Provider Care Teams.  These Care Teams include your primary Cardiologist (physician) and Advanced Practice Providers (APPs -  Physician Assistants and Nurse Practitioners) who all work together to provide you with the care you need, when you need it.  We recommend signing up for the patient portal called "MyChart".  Sign up information is provided on this After Visit Summary.  MyChart is used to connect with patients for Virtual Visits (Telemedicine).  Patients are able to view lab/test results, encounter notes, upcoming appointments, etc.  Non-urgent messages can be sent to your provider as well.   To learn more about what  you can do with MyChart, go to [email or web address].    Your next appointment:   As needed  The format for your next appointment:   In Person  Provider:   Lennie Odor, MD

## 2020-06-30 ENCOUNTER — Ambulatory Visit: Payer: Medicaid Other | Admitting: *Deleted

## 2020-06-30 ENCOUNTER — Ambulatory Visit: Payer: Medicaid Other | Attending: Obstetrics

## 2020-06-30 ENCOUNTER — Encounter: Payer: Self-pay | Admitting: *Deleted

## 2020-06-30 ENCOUNTER — Other Ambulatory Visit (HOSPITAL_COMMUNITY)
Admission: RE | Admit: 2020-06-30 | Discharge: 2020-06-30 | Disposition: A | Discharge status: Home / Self Care | Payer: Medicaid Other | Source: Ambulatory Visit | Attending: Obstetrics and Gynecology | Admitting: Obstetrics and Gynecology

## 2020-06-30 ENCOUNTER — Ambulatory Visit (INDEPENDENT_AMBULATORY_CARE_PROVIDER_SITE_OTHER): Payer: Medicaid Other | Admitting: Obstetrics and Gynecology

## 2020-06-30 ENCOUNTER — Other Ambulatory Visit: Payer: Self-pay

## 2020-06-30 ENCOUNTER — Other Ambulatory Visit: Payer: Self-pay | Admitting: *Deleted

## 2020-06-30 ENCOUNTER — Telehealth: Payer: Self-pay | Admitting: *Deleted

## 2020-06-30 ENCOUNTER — Encounter: Payer: Self-pay | Admitting: Obstetrics and Gynecology

## 2020-06-30 VITALS — BP 131/82 | HR 103 | Wt 174.8 lb

## 2020-06-30 DIAGNOSIS — O34219 Maternal care for unspecified type scar from previous cesarean delivery: Secondary | ICD-10-CM | POA: Diagnosis not present

## 2020-06-30 DIAGNOSIS — O09522 Supervision of elderly multigravida, second trimester: Secondary | ICD-10-CM | POA: Diagnosis not present

## 2020-06-30 DIAGNOSIS — Z362 Encounter for other antenatal screening follow-up: Secondary | ICD-10-CM

## 2020-06-30 DIAGNOSIS — O0990 Supervision of high risk pregnancy, unspecified, unspecified trimester: Secondary | ICD-10-CM

## 2020-06-30 DIAGNOSIS — O321XX0 Maternal care for breech presentation, not applicable or unspecified: Secondary | ICD-10-CM | POA: Diagnosis not present

## 2020-06-30 DIAGNOSIS — N898 Other specified noninflammatory disorders of vagina: Secondary | ICD-10-CM | POA: Diagnosis not present

## 2020-06-30 DIAGNOSIS — Z8632 Personal history of gestational diabetes: Secondary | ICD-10-CM

## 2020-06-30 DIAGNOSIS — O365920 Maternal care for other known or suspected poor fetal growth, second trimester, not applicable or unspecified: Secondary | ICD-10-CM

## 2020-06-30 DIAGNOSIS — Z3A25 25 weeks gestation of pregnancy: Secondary | ICD-10-CM

## 2020-06-30 LAB — CBC
Hematocrit: 35 % (ref 34.0–46.6)
Hemoglobin: 12 g/dL (ref 11.1–15.9)
MCH: 32.2 pg (ref 26.6–33.0)
MCHC: 34.3 g/dL (ref 31.5–35.7)
MCV: 94 fL (ref 79–97)
Platelets: 278 10*3/uL (ref 150–450)
RBC: 3.73 x10E6/uL — ABNORMAL LOW (ref 3.77–5.28)
RDW: 12.1 % (ref 11.7–15.4)
WBC: 10.6 10*3/uL (ref 3.4–10.8)

## 2020-06-30 LAB — TSH: TSH: 3.52 u[IU]/mL (ref 0.450–4.500)

## 2020-06-30 LAB — BASIC METABOLIC PANEL
BUN/Creatinine Ratio: 18 (ref 9–23)
BUN: 9 mg/dL (ref 6–20)
CO2: 19 mmol/L — ABNORMAL LOW (ref 20–29)
Calcium: 9.4 mg/dL (ref 8.7–10.2)
Chloride: 102 mmol/L (ref 96–106)
Creatinine, Ser: 0.51 mg/dL — ABNORMAL LOW (ref 0.57–1.00)
Glucose: 80 mg/dL (ref 65–99)
Potassium: 4.2 mmol/L (ref 3.5–5.2)
Sodium: 136 mmol/L (ref 134–144)
eGFR: 126 mL/min/{1.73_m2} (ref >59)

## 2020-06-30 NOTE — Progress Notes (Signed)
Subjective:  Claudia Lambert is a 35 y.o. G4P1021 at [redacted]w[redacted]d being seen today for work in appt d/t vaginal discharge for the last week. No bleeding. Denies recent IC. OB U/S today normal except for IUGR.  She is currently monitored for the following issues for this high-risk pregnancy and has Marijuana use; Supervision of high risk pregnancy, antepartum; History of diet controlled gestational diabetes mellitus (GDM); Asthma; History of cesarean delivery; Poor fetal growth affecting management of mother in second trimester; and Abnormal echocardiogram on their problem list.  Patient reports see above.  Contractions: Not present. Vag. Bleeding: None.  Movement: Present. Denies leaking of fluid.   The following portions of the patient's history were reviewed and updated as appropriate: allergies, current medications, past family history, past medical history, past social history, past surgical history and problem list. Problem list updated.  Objective:   Vitals:   06/30/20 1445  BP: 131/82  Pulse: (!) 103  Weight: 174 lb 12.8 oz (79.3 kg)    Fetal Status: Fetal Heart Rate (bpm): 152   Movement: Present     General:  Alert, oriented and cooperative. Patient is in no acute distress.  Skin: Skin is warm and dry. No rash noted.   Cardiovascular: Normal heart rate noted  Respiratory: Normal respiratory effort, no problems with respiration noted  Abdomen: Soft, gravid, appropriate for gestational age. Pain/Pressure: Present     Pelvic:  Cervical exam performed        Extremities: Normal range of motion.  Edema: None  Mental Status: Normal mood and affect. Normal behavior. Normal judgment and thought content.   Urinalysis:      Assessment and Plan:  Pregnancy: G4P1021 at [redacted]w[redacted]d  1. Supervision of high risk pregnancy, antepartum  SSE, no pooling, negative fern, cervical swab collected Pt reassured.   Preterm labor symptoms and general obstetric precautions including but not limited to vaginal  bleeding, contractions, leaking of fluid and fetal movement were reviewed in detail with the patient. Please refer to After Visit Summary for other counseling recommendations.  Return in 2 weeks (on 07/14/2020) for OB visit, face to face, any provider, fasting for glucola.   Hermina Staggers, MD

## 2020-06-30 NOTE — Telephone Encounter (Signed)
Called pt and informed her of need to see her in office today due to her report of fluid leaking.  Pt voiced understanding and agreed to appt @ 2:15 today.

## 2020-06-30 NOTE — Progress Notes (Signed)
Patient has leakage of fluid that started started last week and is still leaking (clear fluid). She stated that it feels like "pee" running down her leg but has not scent. She stated that she did not go to hospital.   Having cramps/discomfort in upper region of stomach that started today. Patient denied any bleeding and is not sure if she is having contractions   Benjermin Korber, CMA

## 2020-06-30 NOTE — Patient Instructions (Signed)

## 2020-07-01 LAB — CERVICOVAGINAL ANCILLARY ONLY
Bacterial Vaginitis (gardnerella): NEGATIVE
Candida Glabrata: NEGATIVE
Candida Vaginitis: NEGATIVE
Chlamydia: NEGATIVE
Comment: NEGATIVE
Comment: NEGATIVE
Comment: NEGATIVE
Comment: NEGATIVE
Comment: NEGATIVE
Comment: NORMAL
Neisseria Gonorrhea: NEGATIVE
Trichomonas: NEGATIVE

## 2020-07-02 ENCOUNTER — Other Ambulatory Visit: Payer: Self-pay

## 2020-07-02 ENCOUNTER — Ambulatory Visit: Payer: Medicaid Other | Attending: Advanced Practice Midwife | Admitting: Physical Therapy

## 2020-07-02 ENCOUNTER — Encounter: Payer: Self-pay | Admitting: Physical Therapy

## 2020-07-02 DIAGNOSIS — M899 Disorder of bone, unspecified: Secondary | ICD-10-CM | POA: Diagnosis present

## 2020-07-02 DIAGNOSIS — R252 Cramp and spasm: Secondary | ICD-10-CM

## 2020-07-02 DIAGNOSIS — M6281 Muscle weakness (generalized): Secondary | ICD-10-CM | POA: Diagnosis not present

## 2020-07-02 DIAGNOSIS — M545 Low back pain, unspecified: Secondary | ICD-10-CM

## 2020-07-02 NOTE — Patient Instructions (Signed)
Access Code: [email or web address] URL: [email or web address] Date: 07/02/2020 Prepared by: Eulis Foster  Exercises Quadruped Cat Camel - 2 x daily - 7 x weekly - 1 sets - 10 reps Hip Hinge Rock Back - 2 x daily - 7 x weekly - 1 sets - 10 reps Standing Posterior Pelvic Tilt - 3 x daily - 7 x weekly - 1 sets - 10 reps [facility] [address], [address] [place], [place] [address] Phone # [phone] Fax [phone]

## 2020-07-02 NOTE — Therapy (Signed)
[facility] [address], [address] [place], [place], [address] Phone: [phone]   Fax:  [phone]  Physical Therapy Evaluation  Patient Details  Name: Claudia Lambert MRN: [id number] Date of Birth: 04/16/1985 Referring Provider (PT): Thressa ShellerHeather Hogan CNM   Encounter Date: 07/02/2020   PT End of Session - 07/02/20 1703    Visit Number 1    Date for PT Re-Evaluation 09/24/20    Authorization Type [employer]    PT Start Time 1400    PT Stop Time 1435    PT Time Calculation (min) 35 min    Activity Tolerance Patient tolerated treatment well    Behavior [facility] tasks assessed/performed           Past Medical History:  Diagnosis Date  . Asthma   . Eczema   . Infection    UTI    Past Surgical History:  Procedure Laterality Date  . CESAREAN SECTION     Placenta Previa    There were no vitals filed for this visit.    Subjective Assessment - 07/02/20 1411    Subjective Patient reports pelvic pain. Patient had sciatica with her son. Patient feels her hips and bones are moving. Pain in hips. Wears a belly band. At night she feels a pain or pop. She has a 35 year old.    Patient Stated Goals manage pain    Currently in Pain? Yes    Pain Score 7     Pain Location Hip    Pain Orientation Right;Left    Pain Descriptors / Indicators Sore    Pain Type Acute pain    Pain Onset More than a month ago    Pain Frequency Intermittent    Aggravating Factors  random pain, standing for awhile, sitting then get up, will limp when get up from a sitting position    Pain Relieving Factors belly band, lay down    Multiple Pain Sites Yes    Pain Score 7    Pain Location Back    Pain Orientation Lower    Pain Descriptors / Indicators Sharp;Dull    Pain Type Acute pain    Pain Onset More than a month ago    Pain Frequency Intermittent    Aggravating Factors  random pain    Pain Relieving Factors stretch               [facility] PT Assessment - 07/02/20 0001      Assessment   Medical Diagnosis S33.4XXA Dislocation of symphysis pubis, initial encounter    Referring Provider (PT) Thressa ShellerHeather Hogan CNM    Onset Date/Surgical Date --   04/05/2020   Prior Therapy none      Precautions   Precautions Other (comment)    Precaution Comments pregnant      Restrictions   Weight Bearing Restrictions No      Balance Screen   Has the patient fallen in the past 6 months No    Has the patient had a decrease in activity level because of a fear of falling?  No    Is the patient reluctant to leave their home because of a fear of falling?  No      Home Environment   Living Environment Private residence    Living Arrangements Spouse/significant other      Prior Function   Level of Independence Independent    Vocation Full time employment    Vocation Requirements sitting at  the computer      Cognition   Overall Cognitive Status Within Functional Limits for tasks assessed      Posture/Postural Control   Posture/Postural Control No significant limitations    Posture Comments pregnant posture      ROM / Strength   AROM / PROM / Strength AROM;PROM;Strength      Strength   Right Hip Flexion 5/5    Right Hip Extension 3-/5    Right Hip ABduction 3/5    Right Hip ADduction 3/5    Left Hip Flexion 3/5   painful   Left Hip Extension 3-/5    Left Hip ABduction 3/5    Left Hip ADduction 3/5      Palpation   SI assessment  left ilium rotated posteriorly, tenderness on the left pubic bone and higher than right    Palpation comment tenderness located on the proimal hip adductor; bil. levator ani and obturator internist, along the left SI joint, tightness in the lumbar paraspinals                      Objective measurements completed on examination: See above findings.     Pelvic Floor Special Questions - 07/02/20 0001    Prior Pregnancies Yes    Number of Pregnancies 2   due on 10/07/2020    Number of C-Sections 1    Urinary Leakage No            [facility] Adult PT Treatment/Exercise - 07/02/20 0001      Therapeutic Activites    Therapeutic Activities Other Therapeutic Activities    Other Therapeutic Activities rolling in bed with knees together while breathing out; sidely to standng with knees together and breathing out      Manual Therapy   Manual Therapy Joint mobilization    Joint Mobilization sacral mobilizaiton in prone with a cave for the abdomen; pulling on the left leg to bring the SI joint into correct alignment                  PT Education - 07/02/20 1446    Education Details Access Code: [email or web address]    Person(s) Educated Patient    Methods Explanation;Demonstration;Verbal cues;Handout    Comprehension Returned demonstration;Verbalized understanding            PT Short Term Goals - 07/02/20 1714      PT SHORT TERM GOAL #1   Title independent with initial HEP    Baseline not educated yet    Time 4    Period Weeks    Status New    Target Date 07/30/20             PT Long Term Goals - 07/02/20 1715      PT LONG TERM GOAL #1   Title indepedent with advanced HEP to manage her pain while being pregnant    Baseline not educated yet    Time 12    Period Weeks    Status New    Target Date 09/24/20      PT LONG TERM GOAL #2   Title able to use the belly band for keeping her SI joint in correct alignment    Baseline not educated yet    Time 12    Period Weeks    Status New    Target Date 09/24/20      PT LONG TERM GOAL #3   Title understand how to manuever in bed and with  correct body mechanics to reduce strain on the symphysis pubic bone and back    Baseline not educated yet    Time 12    Period Weeks    Status New    Target Date 09/24/20      PT LONG TERM GOAL #4   Title able to manage her pain around 4/10 instead of 7/10 due to increased bilateral hip strength to >/= 4/10    Time 12    Period Weeks    Status New    Target  Date 09/24/20      PT LONG TERM GOAL #5   Title understand ways to give birth to not strain her back and symphysis pubis    Baseline not educated yet    Time 12    Period Weeks    Status New    Target Date 09/24/20                  Plan - 07/02/20 1446    Clinical Impression Statement Patient is a 35 year old female with lumbar and pelvic pain since 04/2020 with sudden onset. She is 6 months pregnant and due in July. Patient reports her intermittent pelvic and hip pain is 7/10 with movement and standing. Lumbar ROM is full. Her left ilium and pubic symphysis is higher than right. Bilateral hip abduction, adduction, and extension averages 3/5. Sacrum is rotated left and decreased movement of L1-L5. Tenderness located in left SI joint, levator ani, obturator internist, and symphysis pubis. Patient will benefit from skilled therapy to reduce her pain to a managable level throughout her pregnancy.    Personal Factors and Comorbidities Comorbidity 2;Fitness    Comorbidities pregnant and has had a prior c-section; history of placent pervia    Examination-Activity Limitations Locomotion Level;Transfers;Caring for Others;Carry;Dressing;Lift;Stand;Sit;Sleep    Examination-Participation Restrictions Meal Prep;Cleaning;Community Activity;Driving;Laundry    Stability/Clinical Decision Making Evolving/Moderate complexity    Clinical Decision Making Low    Rehab Potential Excellent    PT Frequency 1x / week    PT Duration 12 weeks    PT Treatment/Interventions ADLs/Self Care Home Management;Cryotherapy;Moist Heat;Therapeutic activities;Therapeutic exercise;Neuromuscular re-education;Manual techniques;Patient/family education;Joint Manipulations;Spinal Manipulations;Taping    PT Next Visit Plan see if pelvis is in correct alignment; work on gluteal contraction; sidely thoracic rotation; SI stability exercises; manual work to the muscles of the levator ani, obturator internist, hip adductors;    PT  Home Exercise Plan Access Code: [email or web address]    Consulted and Agree with Plan of Care Patient           Patient will benefit from skilled therapeutic intervention in order to improve the following deficits and impairments:  Decreased range of motion,Increased fascial restricitons,Pain,Decreased strength,Decreased mobility,Decreased activity tolerance,Decreased endurance,Increased muscle spasms  Visit Diagnosis: Muscle weakness (generalized) - Plan: PT plan of care cert/re-cert  Cramp and spasm - Plan: PT plan of care cert/re-cert  Acute bilateral low back pain without sciatica - Plan: PT plan of care cert/re-cert  Pubic bone pain - Plan: PT plan of care cert/re-cert     Problem List Patient Active Problem List   Diagnosis Date Noted  . Abnormal echocardiogram 06/12/2020  . History of cesarean delivery 06/11/2020  . Poor fetal growth affecting management of mother in second trimester 06/11/2020  . Supervision of high risk pregnancy, antepartum 04/10/2020  . History of diet controlled gestational diabetes mellitus (GDM) 04/10/2020  . Asthma 04/10/2020  . Marijuana use 03/04/2020    Eulis Foster, PT 07/02/20 5:22 PM  [facility] [address] [place], [place], [address] Phone: [phone]   Fax:  [phone]  Name: Claudia Lambert MRN: [id number] Date of Birth: 1985-11-30

## 2020-07-07 ENCOUNTER — Other Ambulatory Visit: Payer: Self-pay | Admitting: Lactation Services

## 2020-07-07 DIAGNOSIS — Z3480 Encounter for supervision of other normal pregnancy, unspecified trimester: Secondary | ICD-10-CM

## 2020-07-08 ENCOUNTER — Other Ambulatory Visit: Payer: Self-pay

## 2020-07-08 ENCOUNTER — Ambulatory Visit: Payer: Medicaid Other | Admitting: *Deleted

## 2020-07-08 ENCOUNTER — Encounter: Payer: Self-pay | Admitting: *Deleted

## 2020-07-08 ENCOUNTER — Ambulatory Visit: Payer: Medicaid Other | Attending: Obstetrics and Gynecology

## 2020-07-08 ENCOUNTER — Other Ambulatory Visit: Payer: Self-pay | Admitting: *Deleted

## 2020-07-08 DIAGNOSIS — O365920 Maternal care for other known or suspected poor fetal growth, second trimester, not applicable or unspecified: Secondary | ICD-10-CM | POA: Diagnosis not present

## 2020-07-08 DIAGNOSIS — O0990 Supervision of high risk pregnancy, unspecified, unspecified trimester: Secondary | ICD-10-CM | POA: Insufficient documentation

## 2020-07-08 DIAGNOSIS — O365930 Maternal care for other known or suspected poor fetal growth, third trimester, not applicable or unspecified: Secondary | ICD-10-CM

## 2020-07-08 NOTE — Procedures (Signed)
Claudia Lambert 1986-03-13 [redacted]w[redacted]d  Fetus A Non-Stress Test Interpretation for 07/08/20  Indication: IUGR  Fetal Heart Rate A Mode: External Baseline Rate (A): 150 bpm Variability: Moderate Accelerations: 10 x 10 Decelerations: None Multiple birth?: No  Uterine Activity Mode: Palpation Contraction Frequency (min): none Resting Tone Palpated: Relaxed Resting Time: Adequate  Interpretation (Fetal Testing) Nonstress Test Interpretation: Reactive Overall Impression: Reassuring for gestational age Comments: Dr. Grace Bushy reviewed tracing.

## 2020-07-14 ENCOUNTER — Ambulatory Visit (INDEPENDENT_AMBULATORY_CARE_PROVIDER_SITE_OTHER): Payer: Medicaid Other | Admitting: Obstetrics and Gynecology

## 2020-07-14 ENCOUNTER — Other Ambulatory Visit: Payer: Self-pay

## 2020-07-14 ENCOUNTER — Other Ambulatory Visit: Payer: Medicaid Other

## 2020-07-14 VITALS — BP 128/84 | HR 98 | Wt 178.9 lb

## 2020-07-14 DIAGNOSIS — Z3A27 27 weeks gestation of pregnancy: Secondary | ICD-10-CM

## 2020-07-14 DIAGNOSIS — O43109 Malformation of placenta, unspecified, unspecified trimester: Secondary | ICD-10-CM | POA: Insufficient documentation

## 2020-07-14 DIAGNOSIS — R931 Abnormal findings on diagnostic imaging of heart and coronary circulation: Secondary | ICD-10-CM

## 2020-07-14 DIAGNOSIS — Z3480 Encounter for supervision of other normal pregnancy, unspecified trimester: Secondary | ICD-10-CM

## 2020-07-14 DIAGNOSIS — O0990 Supervision of high risk pregnancy, unspecified, unspecified trimester: Secondary | ICD-10-CM

## 2020-07-14 DIAGNOSIS — O365920 Maternal care for other known or suspected poor fetal growth, second trimester, not applicable or unspecified: Secondary | ICD-10-CM

## 2020-07-14 DIAGNOSIS — Z98891 History of uterine scar from previous surgery: Secondary | ICD-10-CM

## 2020-07-14 DIAGNOSIS — R102 Pelvic and perineal pain: Secondary | ICD-10-CM

## 2020-07-14 DIAGNOSIS — O26893 Other specified pregnancy related conditions, third trimester: Secondary | ICD-10-CM | POA: Insufficient documentation

## 2020-07-14 NOTE — Progress Notes (Signed)
PRENATAL VISIT NOTE  Subjective:  Claudia Lambert is a 35 y.o. G4P1021 at [redacted]w[redacted]d being seen today for ongoing prenatal care.  She is currently monitored for the following issues for this high-risk pregnancy and has Marijuana use; Supervision of high risk pregnancy, antepartum; History of diet controlled gestational diabetes mellitus (GDM); Asthma; History of cesarean delivery; Poor fetal growth affecting management of mother in second trimester; Abnormal echocardiogram; Placental abnormality, antepartum; and Pelvic pain on their problem list.  Patient reports no complaints.  Contractions: Not present. Vag. Bleeding: None.  Movement: Present. Denies leaking of fluid.   The following portions of the patient's history were reviewed and updated as appropriate: allergies, current medications, past family history, past medical history, past social history, past surgical history and problem list.   Objective:   Vitals:   07/14/20 0820  BP: 128/84  Pulse: 98  Weight: 178 lb 14.4 oz (81.1 kg)    Fetal Status: Fetal Heart Rate (bpm): 143   Movement: Present     General:  Alert, oriented and cooperative. Patient is in no acute distress.  Skin: Skin is warm and dry. No rash noted.   Cardiovascular: Normal heart rate noted  Respiratory: Normal respiratory effort, no problems with respiration noted  Abdomen: Soft, gravid, appropriate for gestational age.  Pain/Pressure: Absent     Pelvic: Cervical exam deferred        Extremities: Normal range of motion.  Edema: None  Mental Status: Normal mood and affect. Normal behavior. Normal judgment and thought content.   Assessment and Plan:  Pregnancy: G4P1021 at [redacted]w[redacted]d 1. Supervision of high risk pregnancy, antepartum Routine care.  28wk labs today  2. Poor fetal growth affecting management of mother in second trimester, single or unspecified fetus Has Rpt dopplers on 4/12  4/5: rNST 3/28: 2.6%, 674gm, ac 16%, nl AFI and UA dopplers  3. History  of cesarean delivery I briefly d/w her re: tolac and that she is eligible for this. I told her we will talk to her more about this next visit  S/p 09/2016 pLTCS at 37/0 for low lying placenta  4. Abnormal echocardiogram S/p cardiology consult and likely normal variant. Has rpt maternal echo on 4/14. Negative s/s  5. Placental abnormality, antepartum Large placental lake noted at last u/s; placenta is posterior. H/o previa and c/s with last pregnancy   Continue to follow  6. [redacted] weeks gestation of pregnancy  7. Pelvic pain Followed by PT  Preterm labor symptoms and general obstetric precautions including but not limited to vaginal bleeding, contractions, leaking of fluid and fetal movement were reviewed in detail with the patient. Please refer to After Visit Summary for other counseling recommendations.   Return in about 2 weeks (around 07/28/2020) for in person, md visit.  Future Appointments  Date Time Provider Department Center  07/14/2020  8:50 AM [facility] LAB [facility]  07/15/2020  2:00 PM [facility] NURSE [facility]  07/15/2020  2:15 PM [facility] NST [facility]  07/15/2020  3:45 PM [facility] US1 [facility]  07/17/2020  7:35 AM [facility] ECHO 5 [id number]  07/22/2020  3:00 PM Theressa Millard, PT [facility]  07/24/2020  7:30 AM [facility] NURSE [facility]  07/24/2020  7:45 AM [facility] US5 [facility]  07/24/2020  8:45 AM [facility] NST [facility]  07/29/2020  2:00 PM Theressa Millard, PT [facility]  07/31/2020  7:30 AM [facility] NURSE [facility]  07/31/2020  7:45 AM [facility] US5 [facility]  07/31/2020  8:45 AM [facility] NST [facility]  08/07/2020  1:45 PM [facility] NURSE [facility]  08/07/2020  2:00 PM [facility] US1 [facility]  08/07/2020  3:15 PM [facility] NST [facility]  08/14/2020 12:30 PM [facility] NURSE [facility]  08/14/2020 12:45 PM [facility] US5 [facility]  08/26/2020  2:00 PM Theressa Millard, PT [facility]  09/02/2020  8:30 AM Theressa Millard, PT [facility]  09/16/2020  4:00 PM  Theressa Millard, PT [facility]  09/23/2020  4:00 PM Theressa Millard, PT [facility]  09/30/2020  4:00 PM Theressa Millard, PT [facility]     Bing, MD

## 2020-07-15 ENCOUNTER — Ambulatory Visit (HOSPITAL_BASED_OUTPATIENT_CLINIC_OR_DEPARTMENT_OTHER): Payer: Medicaid Other | Admitting: *Deleted

## 2020-07-15 ENCOUNTER — Telehealth: Payer: Self-pay

## 2020-07-15 ENCOUNTER — Ambulatory Visit: Payer: Medicaid Other | Admitting: *Deleted

## 2020-07-15 ENCOUNTER — Other Ambulatory Visit: Payer: Self-pay | Admitting: Obstetrics and Gynecology

## 2020-07-15 ENCOUNTER — Ambulatory Visit: Payer: Medicaid Other | Attending: Obstetrics and Gynecology

## 2020-07-15 ENCOUNTER — Encounter: Payer: Self-pay | Admitting: *Deleted

## 2020-07-15 DIAGNOSIS — O09523 Supervision of elderly multigravida, third trimester: Secondary | ICD-10-CM

## 2020-07-15 DIAGNOSIS — O0990 Supervision of high risk pregnancy, unspecified, unspecified trimester: Secondary | ICD-10-CM

## 2020-07-15 DIAGNOSIS — Z8632 Personal history of gestational diabetes: Secondary | ICD-10-CM

## 2020-07-15 DIAGNOSIS — O365930 Maternal care for other known or suspected poor fetal growth, third trimester, not applicable or unspecified: Secondary | ICD-10-CM | POA: Diagnosis not present

## 2020-07-15 DIAGNOSIS — O365920 Maternal care for other known or suspected poor fetal growth, second trimester, not applicable or unspecified: Secondary | ICD-10-CM | POA: Diagnosis present

## 2020-07-15 DIAGNOSIS — Z3A28 28 weeks gestation of pregnancy: Secondary | ICD-10-CM

## 2020-07-15 DIAGNOSIS — O34219 Maternal care for unspecified type scar from previous cesarean delivery: Secondary | ICD-10-CM | POA: Diagnosis not present

## 2020-07-15 LAB — CBC
Hematocrit: 34.7 % (ref 34.0–46.6)
Hemoglobin: 12.1 g/dL (ref 11.1–15.9)
MCH: 33.2 pg — ABNORMAL HIGH (ref 26.6–33.0)
MCHC: 34.9 g/dL (ref 31.5–35.7)
MCV: 95 fL (ref 79–97)
Platelets: 278 10*3/uL (ref 150–450)
RBC: 3.64 x10E6/uL — ABNORMAL LOW (ref 3.77–5.28)
RDW: 12.6 % (ref 11.7–15.4)
WBC: 11.7 10*3/uL — ABNORMAL HIGH (ref 3.4–10.8)

## 2020-07-15 LAB — GLUCOSE TOLERANCE, 2 HOURS W/ 1HR
Glucose, 1 hour: 129 mg/dL (ref 65–179)
Glucose, 2 hour: 106 mg/dL (ref 65–152)
Glucose, Fasting: 95 mg/dL — ABNORMAL HIGH (ref 65–91)

## 2020-07-15 LAB — HIV ANTIBODY (ROUTINE TESTING W REFLEX): HIV Screen 4th Generation wRfx: NONREACTIVE

## 2020-07-15 LAB — RPR: RPR Ser Ql: NONREACTIVE

## 2020-07-15 NOTE — Telephone Encounter (Signed)
Call placed to pt after receiving phone call from [facility] of elevated BP of 141/79 with nausea.  Spoke with pt. Pt states is feeling better and denies nausea now. Pt is now having MFM appt and BP taken there is good.  Pt advised to only take BP once a week or when having elevated BP symptoms. Pt agreeable and verbalized understanding.   Judeth Cornfield, RN  07/15/20.

## 2020-07-15 NOTE — Procedures (Signed)
Claudia Lambert 09/03/1985 [redacted]w[redacted]d  Fetus A Non-Stress Test Interpretation for 07/15/20  Indication: IUGR  Fetal Heart Rate A Mode: External Baseline Rate (A): 140 bpm Variability: Moderate Accelerations: 10 x 10 Decelerations: None Multiple birth?: No  Uterine Activity Mode: Palpation,Toco Contraction Frequency (min): none Resting Tone Palpated: Relaxed Resting Time: Adequate  Interpretation (Fetal Testing) Nonstress Test Interpretation: Reactive Overall Impression: Reassuring for gestational age Comments: Dr. Grace Bushy reviewed tracing.

## 2020-07-16 ENCOUNTER — Encounter: Payer: Self-pay | Admitting: Obstetrics and Gynecology

## 2020-07-16 DIAGNOSIS — O24419 Gestational diabetes mellitus in pregnancy, unspecified control: Secondary | ICD-10-CM

## 2020-07-16 HISTORY — DX: Gestational diabetes mellitus in pregnancy, unspecified control: O24.419

## 2020-07-17 ENCOUNTER — Other Ambulatory Visit: Payer: Self-pay

## 2020-07-17 ENCOUNTER — Ambulatory Visit (HOSPITAL_COMMUNITY): Payer: Medicaid Other | Attending: Cardiology

## 2020-07-17 ENCOUNTER — Telehealth: Payer: Self-pay | Admitting: Lactation Services

## 2020-07-17 DIAGNOSIS — I253 Aneurysm of heart: Secondary | ICD-10-CM | POA: Insufficient documentation

## 2020-07-17 LAB — ECHOCARDIOGRAM COMPLETE
Area-P 1/2: 2.74 cm2
S' Lateral: 2.45 cm

## 2020-07-17 NOTE — Telephone Encounter (Signed)
-----   Message from [place] Bing, MD sent at 07/16/2020  3:16 PM EDT ----- She has GDM. Please set her up with education visits, etc.

## 2020-07-17 NOTE — Telephone Encounter (Signed)
Called patient to inform her that her labwork shows she is a gestational Diabetic. Patient reports she was a Gestational Diabetic with her last pregnancy and that glucose was normal after pregnancy.   Reviewed we recommend that she meet with the Diabetes Educator and that she take her blood sugars 4 times a day during the pregnancy. Patient reports she no longer has her meter she used previously. Reviewed she will receive one after meeting with Diabetes Educator.   Patient with no questions or concerns at this time.

## 2020-07-22 ENCOUNTER — Encounter: Payer: Self-pay | Admitting: Physical Therapy

## 2020-07-22 ENCOUNTER — Encounter: Payer: Medicaid Other | Attending: Obstetrics and Gynecology | Admitting: Physical Therapy

## 2020-07-22 ENCOUNTER — Other Ambulatory Visit: Payer: Self-pay

## 2020-07-22 DIAGNOSIS — M6281 Muscle weakness (generalized): Secondary | ICD-10-CM

## 2020-07-22 DIAGNOSIS — M899 Disorder of bone, unspecified: Secondary | ICD-10-CM | POA: Diagnosis present

## 2020-07-22 DIAGNOSIS — Z3A28 28 weeks gestation of pregnancy: Secondary | ICD-10-CM | POA: Diagnosis not present

## 2020-07-22 DIAGNOSIS — O365930 Maternal care for other known or suspected poor fetal growth, third trimester, not applicable or unspecified: Secondary | ICD-10-CM | POA: Diagnosis not present

## 2020-07-22 DIAGNOSIS — R252 Cramp and spasm: Secondary | ICD-10-CM | POA: Insufficient documentation

## 2020-07-22 DIAGNOSIS — M545 Low back pain, unspecified: Secondary | ICD-10-CM | POA: Diagnosis present

## 2020-07-22 NOTE — Patient Instructions (Signed)
Access Code: [email or web address] URL: [email or web address] Date: 07/22/2020 Prepared by: Eulis Foster  Exercises Quadruped Cat Camel - 2 x daily - 7 x weekly - 1 sets - 10 reps Hip Hinge Rock Back - 2 x daily - 7 x weekly - 1 sets - 10 reps Standing Posterior Pelvic Tilt - 3 x daily - 7 x weekly - 1 sets - 10 reps Quadruped Thoracic Rotation Full Range with Hand on Neck - 1 x daily - 7 x weekly - 1 sets - 5 reps Child's Pose with Thread the Needle - 1 x daily - 7 x weekly - 1 sets - 5 reps Sidelying Open Book Thoracic Rotation with Knee on Foam Roll - 1 x daily - 7 x weekly - 1 sets - 10 reps Google with Blocks Underneath Hands, Knees Apart, and Hands Forward - 1 x daily - 7 x weekly - 1 sets - 1 reps Standing 'L' Stretch at [employer] - 1 x daily - 7 x weekly - 1 sets - 1 reps Eulis Foster, PT [facility] [address], [address] [place], [place] [address] W: [phone] [email or web address]

## 2020-07-22 NOTE — Therapy (Signed)
[facility] at [facility] [address] [place], [place], [address] Phone: [phone]   Fax:  [phone]  Physical Therapy Treatment  Patient Details  Name: Claudia Lambert MRN: [id number] Date of Birth: March 03, 1986 Referring Provider (PT): Thressa Sheller CNM   Encounter Date: 07/22/2020   PT End of Session - 07/22/20 1501    Visit Number 2    Date for PT Re-Evaluation 09/24/20    Authorization Type Healthy blue    Authorization Time Period 4/18-6/22    Authorization - Visit Number 1    Authorization - Number of Visits 12    PT Start Time 1450    PT Stop Time 1545    PT Time Calculation (min) 55 min    Activity Tolerance Patient tolerated treatment well    Behavior During Therapy [facility] for tasks assessed/performed           Past Medical History:  Diagnosis Date  . Asthma   . Eczema   . GDM (gestational diabetes mellitus) 07/16/2020  . Infection    UTI    Past Surgical History:  Procedure Laterality Date  . CESAREAN SECTION     Placenta Previa    There were no vitals filed for this visit.   Subjective Assessment - 07/22/20 1457    Subjective I feel better from last visit. Do not feel the hips and bones moving as much. Soes not have to wear the belly band as much but using the physioball.    Patient Stated Goals manage pain    Currently in Pain? Yes    Pain Score 6     Pain Location Hip    Pain Orientation Right;Left    Pain Descriptors / Indicators Sore    Pain Onset More than a month ago    Pain Frequency Intermittent    Aggravating Factors  sititng  or standing long time, getting in and out of the car    Pain Relieving Factors lay down    Pain Score 8    Pain Location Back    Pain Orientation Lower    Pain Descriptors / Indicators Dull;Sharp    Pain Type Acute pain    Pain Onset More than a month ago    Pain Frequency Intermittent    Aggravating Factors  random pain; standing to wash dishes, sitting at work  desk    Pain Relieving Factors stretch              [facility] PT Assessment - 07/22/20 0001      Palpation   SI assessment  left ilium rotated posteriorly,                         [facility] Adult PT Treatment/Exercise - 07/22/20 0001      Lumbar Exercises: Sidelying   Other Sidelying Lumbar Exercises Toracic rotation with top leg on bolster then coming back to starting position contract the abdominal    Other Sidelying Lumbar Exercises iliacus pull back 10x each side with tactile cues to perform the correct movement      Lumbar Exercises: Quadruped   Other Quadruped Lumbar Exercises hip hinge 10x to stretch the lumbar sacral junction then into childs pose with arm on the pummel    Other Quadruped Lumbar Exercises quadruped switching from thoracic rotation with hands behind her head into thread the needle pose 10x each side      Manual Therapy   Manual Therapy Joint  mobilization;Soft tissue mobilization;Myofascial release    Joint Mobilization sacral mobilizaiton in prone with a cave for the abdomen; pulling on the left leg to bring the SI joint into correct alignment in supine    Soft tissue mobilization left coccygeus, and levator ani in prone    Myofascial Release using a suction cup and iastim device to the lumbar and thoracic paraspinals                  PT Education - 07/22/20 1604    Education Details Access Code: [email or web address]    Person(s) Educated Patient    Methods Explanation;Demonstration;Verbal cues;Handout    Comprehension Returned demonstration;Verbalized understanding            PT Short Term Goals - 07/22/20 1614      PT SHORT TERM GOAL #1   Title independent with initial HEP    Time 4    Period Weeks    Status Achieved             PT Long Term Goals - 07/02/20 1715      PT LONG TERM GOAL #1   Title indepedent with advanced HEP to manage her pain while being pregnant    Baseline not educated yet    Time 12    Period Weeks     Status New    Target Date 09/24/20      PT LONG TERM GOAL #2   Title able to use the belly band for keeping her SI joint in correct alignment    Baseline not educated yet    Time 12    Period Weeks    Status New    Target Date 09/24/20      PT LONG TERM GOAL #3   Title understand how to manuever in bed and with correct body mechanics to reduce strain on the symphysis pubic bone and back    Baseline not educated yet    Time 12    Period Weeks    Status New    Target Date 09/24/20      PT LONG TERM GOAL #4   Title able to manage her pain around 4/10 instead of 7/10 due to increased bilateral hip strength to >/= 4/10    Time 12    Period Weeks    Status New    Target Date 09/24/20      PT LONG TERM GOAL #5   Title understand ways to give birth to not strain her back and symphysis pubis    Baseline not educated yet    Time 12    Period Weeks    Status New    Target Date 09/24/20                 Plan - 07/22/20 1608    Clinical Impression Statement Patient left therapy today with no pain and able to walk without a flexed posture. She was able to move with greater ease when on the mat. She does not have to wear the belly band as much due to less pain. Patient had fascial tightness in the thoracic, lumbar and gluteals. She has difficutly with iliacus pullbacks on the left. Patient was able to stand straighter after therapy. Patient will benefit from skilled therapy to reduce her pain to a managle level through out pregnancy.    Personal Factors and Comorbidities Comorbidity 2;Fitness    Comorbidities pregnant and has had a prior c-section; history of placent pervia    Examination-Activity  Limitations Locomotion Level;Transfers;Caring for Others;Carry;Dressing;Lift;Stand;Sit;Sleep    Examination-Participation Restrictions Meal Prep;Cleaning;Community Activity;Driving;Laundry    Stability/Clinical Decision Making Evolving/Moderate complexity    Rehab Potential Excellent     PT Frequency 1x / week    PT Duration 12 weeks    PT Treatment/Interventions ADLs/Self Care Home Management;Cryotherapy;Moist Heat;Therapeutic activities;Therapeutic exercise;Neuromuscular re-education;Manual techniques;Patient/family education;Joint Manipulations;Spinal Manipulations;Taping    PT Next Visit Plan see if pelvis is in correct alignment; work on gluteal contraction; SI stability exercises; manual work to the muscles of the levator ani on the left; hip adductors, and lumbar paraspinals; go over different labor pasitions, perineal massage education to do at 35 weeks    PT Home Exercise Plan Access Code: [email or web address]    Recommended Other Services MD signed the initial evaluation    Consulted and Agree with Plan of Care Patient           Patient will benefit from skilled therapeutic intervention in order to improve the following deficits and impairments:  Decreased range of motion,Increased fascial restricitons,Pain,Decreased strength,Decreased mobility,Decreased activity tolerance,Decreased endurance,Increased muscle spasms  Visit Diagnosis: Muscle weakness (generalized)  Cramp and spasm  Acute bilateral low back pain without sciatica  Pubic bone pain     Problem List Patient Active Problem List   Diagnosis Date Noted  . GDM (gestational diabetes mellitus) 07/16/2020  . Large placenta lake 07/14/2020  . Pelvic pain 07/14/2020  . Abnormal echocardiogram 06/12/2020  . History of cesarean delivery 06/11/2020  . Poor fetal growth affecting management of mother in second trimester 06/11/2020  . Supervision of high risk pregnancy, antepartum 04/10/2020  . History of diet controlled gestational diabetes mellitus (GDM) 04/10/2020  . Asthma 04/10/2020  . Marijuana use 03/04/2020    Eulis Foster, PT 07/22/20 4:15 PM   [place] [facility] at [facility] [address], [address] [place], [place], [address] Phone: [phone]   Fax:   [phone]  Name: Claudia Lambert MRN: [id number] Date of Birth: 11/09/1985

## 2020-07-24 ENCOUNTER — Ambulatory Visit: Payer: Medicaid Other | Admitting: *Deleted

## 2020-07-24 ENCOUNTER — Other Ambulatory Visit: Payer: Self-pay

## 2020-07-24 ENCOUNTER — Ambulatory Visit: Payer: Medicaid Other | Attending: Obstetrics and Gynecology

## 2020-07-24 ENCOUNTER — Ambulatory Visit (HOSPITAL_BASED_OUTPATIENT_CLINIC_OR_DEPARTMENT_OTHER): Payer: Medicaid Other | Admitting: *Deleted

## 2020-07-24 ENCOUNTER — Encounter: Payer: Self-pay | Admitting: *Deleted

## 2020-07-24 DIAGNOSIS — O365931 Maternal care for other known or suspected poor fetal growth, third trimester, fetus 1: Secondary | ICD-10-CM | POA: Insufficient documentation

## 2020-07-24 DIAGNOSIS — O34219 Maternal care for unspecified type scar from previous cesarean delivery: Secondary | ICD-10-CM

## 2020-07-24 DIAGNOSIS — O09523 Supervision of elderly multigravida, third trimester: Secondary | ICD-10-CM | POA: Diagnosis not present

## 2020-07-24 DIAGNOSIS — O09293 Supervision of pregnancy with other poor reproductive or obstetric history, third trimester: Secondary | ICD-10-CM | POA: Diagnosis not present

## 2020-07-24 DIAGNOSIS — Z3A29 29 weeks gestation of pregnancy: Secondary | ICD-10-CM | POA: Insufficient documentation

## 2020-07-24 DIAGNOSIS — O0990 Supervision of high risk pregnancy, unspecified, unspecified trimester: Secondary | ICD-10-CM | POA: Diagnosis present

## 2020-07-24 DIAGNOSIS — O365930 Maternal care for other known or suspected poor fetal growth, third trimester, not applicable or unspecified: Secondary | ICD-10-CM

## 2020-07-24 DIAGNOSIS — O365920 Maternal care for other known or suspected poor fetal growth, second trimester, not applicable or unspecified: Secondary | ICD-10-CM | POA: Diagnosis not present

## 2020-07-24 NOTE — Procedures (Signed)
Claudia Lambert April 02, 1986 [redacted]w[redacted]d  Fetus A Non-Stress Test Interpretation for 07/24/20  Indication: IUGR  Fetal Heart Rate A Mode: External Baseline Rate (A): 160 bpm Variability: Moderate Accelerations: 10 x 10 Decelerations: None Multiple birth?: No  Uterine Activity Mode: Palpation,Toco Contraction Frequency (min): none Resting Tone Palpated: Relaxed Resting Time: Adequate  Interpretation (Fetal Testing) Nonstress Test Interpretation: Reactive Overall Impression: Reassuring for gestational age Comments: Dr. Parke Poisson reviewed tracing.

## 2020-07-29 ENCOUNTER — Other Ambulatory Visit: Payer: Self-pay

## 2020-07-29 ENCOUNTER — Encounter: Payer: Medicaid Other | Admitting: Physical Therapy

## 2020-07-29 ENCOUNTER — Encounter: Payer: Self-pay | Admitting: Physical Therapy

## 2020-07-29 DIAGNOSIS — M6281 Muscle weakness (generalized): Secondary | ICD-10-CM | POA: Diagnosis not present

## 2020-07-29 DIAGNOSIS — M899 Disorder of bone, unspecified: Secondary | ICD-10-CM

## 2020-07-29 DIAGNOSIS — M545 Low back pain, unspecified: Secondary | ICD-10-CM

## 2020-07-29 DIAGNOSIS — R252 Cramp and spasm: Secondary | ICD-10-CM

## 2020-07-29 NOTE — Therapy (Signed)
[facility]patient Rehabilitation at [facility] [address], [address] [place], [place], [address] Phone: [phone]   Fax:  [phone]  Physical Therapy Treatment  Patient Details  Name: Claudia Lambert MRN: [id number] Date of Birth: [birth year] Referring Provider (PT): Thressa Sheller CNM   Encounter Date: 07/29/2020   PT End of Session - 07/29/20 1458    Visit Number 3    Date for PT Re-Evaluation 09/24/20    Authorization Type Healthy blue    Authorization Time Period 4/18-6/22    Authorization - Visit Number 2    Authorization - Number of Visits 12    PT Start Time 1400    PT Stop Time 1445    PT Time Calculation (min) 45 min    Activity Tolerance Patient tolerated treatment well    Behavior During Therapy [facility] for tasks assessed/performed           Past Medical History:  Diagnosis Date  . Asthma   . Eczema   . GDM (gestational diabetes mellitus) 07/16/2020  . Infection    UTI    Past Surgical History:  Procedure Laterality Date  . CESAREAN SECTION     Placenta Previa    There were no vitals filed for this visit.   Subjective Assessment - 07/29/20 1406    Subjective I am feeling good. I have been doing  my stretches and with the ball. I still feel a little pain. Pain is not as often but just as intense.    Patient Stated Goals manage pain    Currently in Pain? Yes    Pain Score 7     Pain Location Hip    Pain Orientation Right;Left    Pain Descriptors / Indicators Sore    Pain Type Acute pain    Pain Onset More than a month ago    Pain Frequency Intermittent    Aggravating Factors  going from sit to stand; getting in and out of a car, getting out of bed and lay from one side to the other side    Pain Relieving Factors lay down, exercises    Multiple Pain Sites Yes    Pain Score 8    Pain Location Back    Pain Orientation Lower    Pain Descriptors / Indicators Dull;Sharp    Pain Type Acute pain    Pain Onset More than a month ago     Pain Frequency Intermittent    Aggravating Factors  randome pain, standing to wash dishes, sitting at work desk    Pain Relieving Factors stretch, lean over              [facility] PT Assessment - 07/29/20 0001      Palpation   SI assessment  ASIS are equal                         [facility] Adult PT Treatment/Exercise - 07/29/20 0001      Self-Care   Self-Care Other Self-Care Comments    Other Self-Care Comments  education on care after c-section to reduce restrictions and sensitivity      Therapeutic Activites    Therapeutic Activities Other Therapeutic Activities;ADL's    Other Therapeutic Activities rolling in bed with pillow between knees, breathing out during the hardest part of an activity, sitting with knees togther      Lumbar Exercises: Seated   Other Seated Lumbar Exercises sit on chair and reach on a ball  to lean over and stretc back and move side to side      Manual Therapy   Manual Therapy Joint mobilization;Soft tissue mobilization    Joint Mobilization gapping of L1-L5 in sidely, distract of the L%-S1 joint in sidely, rotational mobilization to L1-L5 in sidely,    Soft tissue mobilization manual work to the suprapubic area, along the left psoas and inguinal ligament                  PT Education - 07/29/20 1458    Education Details education on c-section scar massage    Person(s) Educated Patient    Methods Explanation;Demonstration;Handout    Comprehension Returned demonstration;Verbalized understanding            PT Short Term Goals - 07/29/20 1507      PT SHORT TERM GOAL #1   Title independent with initial HEP    Time 4    Period Weeks    Status Achieved             PT Long Term Goals - 07/29/20 1507      PT LONG TERM GOAL #1   Title indepedent with advanced HEP to manage her pain while being pregnant    Time 12    Period Weeks    Status On-going      PT LONG TERM GOAL #2   Title able to use the belly band for keeping  her SI joint in correct alignment    Time 12    Period Weeks    Status Achieved      PT LONG TERM GOAL #3   Title understand how to manuever in bed and with correct body mechanics to reduce strain on the symphysis pubic bone and back    Time 12    Period Weeks    Status Achieved      PT LONG TERM GOAL #4   Title able to manage her pain around 4/10 instead of 7/10 due to increased bilateral hip strength to >/= 4/10    Time 12    Period Weeks    Status On-going      PT LONG TERM GOAL #5   Title understand ways to give birth to not strain her back and symphysis pubis    Baseline not educated yet    Time 12    Period Weeks    Status On-going                 Plan - 07/29/20 1459    Clinical Impression Statement Patient reports her pain less often but same intensity. Patient is able to walk in erect posture. Patient pelvis in correct alignment. She continues to have tenderness located in the suprapubic area. Patient had increased movement in her spine and SI joint after therapy. Patient will benefit from skilled therapy to reduce her pain to a managable level throughout her pregnance.    Personal Factors and Comorbidities Comorbidity 2;Fitness    Comorbidities pregnant and has had a prior c-section; history of placent pervia    Examination-Activity Limitations Locomotion Level;Transfers;Caring for Others;Carry;Dressing;Lift;Stand;Sit;Sleep    Examination-Participation Restrictions Meal Prep;Cleaning;Community Activity;Driving;Laundry    Stability/Clinical Decision Making Evolving/Moderate complexity    Rehab Potential Excellent    PT Frequency 1x / week    PT Duration 12 weeks    PT Treatment/Interventions ADLs/Self Care Home Management;Cryotherapy;Moist Heat;Therapeutic activities;Therapeutic exercise;Neuromuscular re-education;Manual techniques;Patient/family education;Joint Manipulations;Spinal Manipulations;Taping    PT Next Visit Plan work on gluteal contraction; SI  stability exercises; manual work to the muscles of the levator ani on the left; hip adductors, and lumbar paraspinals    PT Home Exercise Plan Access Code: [email or web address]    Consulted and Agree with Plan of Care Patient           Patient will benefit from skilled therapeutic intervention in order to improve the following deficits and impairments:  Decreased range of motion,Increased fascial restricitons,Pain,Decreased strength,Decreased mobility,Decreased activity tolerance,Decreased endurance,Increased muscle spasms  Visit Diagnosis: Muscle weakness (generalized)  Cramp and spasm  Acute bilateral low back pain without sciatica  Pubic bone pain     Problem List Patient Active Problem List   Diagnosis Date Noted  . GDM (gestational diabetes mellitus) 07/16/2020  . Large placenta lake 07/14/2020  . Pelvic pain 07/14/2020  . Abnormal echocardiogram 06/12/2020  . History of cesarean delivery 06/11/2020  . Poor fetal growth affecting management of mother in second trimester 06/11/2020  . Supervision of high risk pregnancy, antepartum 04/10/2020  . History of diet controlled gestational diabetes mellitus (GDM) 04/10/2020  . Asthma 04/10/2020  . Marijuana use 03/04/2020    Eulis Foster, PT 07/29/20 3:09 PM   [place] [facility] at [facility] [address], [address] [place], [place], [address] Phone: [phone]   Fax:  [phone]  Name: Claudia Lambert MRN: [id number] Date of Birth: 1985/09/12

## 2020-07-31 ENCOUNTER — Ambulatory Visit: Payer: Medicaid Other | Admitting: *Deleted

## 2020-07-31 ENCOUNTER — Encounter: Payer: Self-pay | Admitting: *Deleted

## 2020-07-31 ENCOUNTER — Ambulatory Visit (INDEPENDENT_AMBULATORY_CARE_PROVIDER_SITE_OTHER): Payer: Medicaid Other | Admitting: Obstetrics & Gynecology

## 2020-07-31 ENCOUNTER — Encounter: Payer: Medicaid Other | Admitting: Obstetrics & Gynecology

## 2020-07-31 ENCOUNTER — Ambulatory Visit: Payer: Medicaid Other | Attending: Obstetrics and Gynecology

## 2020-07-31 ENCOUNTER — Other Ambulatory Visit: Payer: Self-pay

## 2020-07-31 ENCOUNTER — Ambulatory Visit (HOSPITAL_BASED_OUTPATIENT_CLINIC_OR_DEPARTMENT_OTHER): Payer: Medicaid Other | Admitting: *Deleted

## 2020-07-31 VITALS — BP 137/80 | HR 101 | Wt 180.9 lb

## 2020-07-31 DIAGNOSIS — O365920 Maternal care for other known or suspected poor fetal growth, second trimester, not applicable or unspecified: Secondary | ICD-10-CM | POA: Diagnosis not present

## 2020-07-31 DIAGNOSIS — O365931 Maternal care for other known or suspected poor fetal growth, third trimester, fetus 1: Secondary | ICD-10-CM | POA: Diagnosis present

## 2020-07-31 DIAGNOSIS — O365930 Maternal care for other known or suspected poor fetal growth, third trimester, not applicable or unspecified: Secondary | ICD-10-CM | POA: Insufficient documentation

## 2020-07-31 DIAGNOSIS — O0990 Supervision of high risk pregnancy, unspecified, unspecified trimester: Secondary | ICD-10-CM

## 2020-07-31 DIAGNOSIS — R931 Abnormal findings on diagnostic imaging of heart and coronary circulation: Secondary | ICD-10-CM

## 2020-07-31 DIAGNOSIS — Z3A30 30 weeks gestation of pregnancy: Secondary | ICD-10-CM | POA: Diagnosis not present

## 2020-07-31 LAB — POCT URINALYSIS DIP (DEVICE)
Bilirubin Urine: NEGATIVE
Glucose, UA: NEGATIVE mg/dL
Hgb urine dipstick: NEGATIVE
Ketones, ur: NEGATIVE mg/dL
Leukocytes,Ua: NEGATIVE
Nitrite: NEGATIVE
Protein, ur: NEGATIVE mg/dL
Specific Gravity, Urine: 1.02 (ref 1.005–1.030)
Urobilinogen, UA: 0.2 mg/dL (ref 0.0–1.0)
pH: 7.5 (ref 5.0–8.0)

## 2020-07-31 MED ORDER — GLUCOSE BLOOD VI STRP: ORAL_STRIP | 12 refills | Status: DC

## 2020-07-31 MED ORDER — ACCU-CHEK GUIDE W/DEVICE KIT: 1.0000 | PACK | Freq: Four times a day (QID) | 0 refills | Status: DC

## 2020-07-31 MED ORDER — ACCU-CHEK SOFTCLIX LANCETS MISC: 12 refills | Status: DC

## 2020-07-31 NOTE — Patient Instructions (Signed)
Gestational Diabetes Mellitus, Self-Care Caring for yourself after a diagnosis of gestational diabetes mellitus means keeping your blood sugar under control. This can be done through nutrition, exercise, lifestyle changes, insulin and other medicines, and support from your health care team. Your health care provider will set individualized treatment goals for you. What are the risks? If left untreated, gestational diabetes can cause problems for mother and baby. For the mother Women who get gestational diabetes are more likely to:  Have labor induced and deliver early.  Have problems during labor and delivery, if the baby is larger than normal. This includes difficult labor and damage to the birth canal.  Have a cesarean delivery.  Have problems with blood pressure, including high blood pressure and preeclampsia.  Get it again if they become pregnant.  Develop type 2 diabetes in the future. For the baby Gestational diabetes that is not treated can cause the baby to have:  Low blood glucose (hypoglycemia).  Larger-than-normal body size (macrosomia).  Breathing problems. How to monitor blood glucose Check your blood glucose every day and as often as told by your health care provider. To do this: 1. Wash your hands with soap and water for at least 20 seconds. 2. Prick the side of your finger (not the tip) with the lancet. Use a different finger each time. 3. Gently rub the finger until a small drop of blood appears. 4. Follow instructions that come with your meter for inserting the test strip, applying blood to the strip, and getting the result. 5. Write down your result and any notes. Blood glucose goals are:  95 mg/dL (5.3 mmol/L) when fasting.  140 mg/dL (7.8 mmol/L) 1 hour after a meal.  120 mg/dL (6.7 mmol/L) 2 hours after a meal.   Follow these instructions at home: Medicines  Take over-the-counter and prescription medicines only as told by your health care  provider.  If your health care provider prescribed insulin or other diabetes medicines: ? Take them every day. ? Do not run out of insulin or other medicines. Plan ahead so you always have them available. Eating and drinking  Follow instructions from your health care provider about eating or drinking restrictions.  See a diet and nutrition expert (registered dietician) to help you create an eating plan that helps control your diabetes. The foods in this plan will include: ? Lean proteins. ? Complex carbohydrates. These are carbohydrates that contain fiber, have a lot of nutrients, and are digested slowly. They include dried beans, nuts, and whole grain breads, cereals, or pasta. ? Fresh fruits and vegetables. ? Low-fat dairy products. ? Healthy fats.  Eat healthy snacks between nutritious meals.  Drink enough fluid to keep your urine pale yellow.  Keep a record of the carbohydrates that you eat. To do this: ? Read food labels. ? Learn the standard serving sizes of foods.  Make a sick day plan with your health care provider before you get sick. Follow this plan whenever you cannot eat or drink as usual.   Activity  Do exercises as told by your health care provider.  Do 30 or more minutes of physical activity a day, or as much physical activity as your health care provider recommends. It may help to control blood glucose levels after a meal if you: ? Do 10 minutes of exercise after each meal. ? Start this exercise 30 minutes after the meal.  If you start a new exercise or activity, work with your health care provider to adjust your   insulin, other medicines, or food as needed. Lifestyle  Do not drink alcohol.  Do not use any products that contain nicotine or tobacco, such as cigarettes, e-cigarettes, and chewing tobacco. If you need help quitting, ask your health care provider.  Learn to manage stress. If you need help with this, ask your health care provider. Body care  Keep  your vaccines up to date.  Practice good oral hygiene. To do this: ? Clean your teeth and gums two times a day. ? Floss one or more times a day. ? Visit your dentist one or more times every 6 months.  Stay at a healthy weight while you are pregnant. Your expected weight gain depends on your BMI (body mass index) before pregnancy. General instructions  Talk with your health care provider about the risk for high blood pressure during pregnancy (preeclampsia and eclampsia).  Share your diabetes management plan with people in your workplace, school, and household.  Check your urine for ketones when sick and as told by your health care provider. Ketones are made by the liver when a lack of glucose forces the body to use fat for energy.  Carry a medical alert card or wear medical alert jewelry that says you have gestational diabetes.  Keep all follow-up visits. This is important. Get care after delivery  Have your blood glucose level checked with an oral glucose tolerance test (OGTT) 4-12 weeks after delivery.  Get screened for diabetes at least every 3 years, or as often as told by your health care provider. Where to find more information  American Diabetes Association (ADA): diabetes.org  Association of Diabetes Care & Education Specialists (ADCES): diabeteseducator.org  Centers for Disease Control and Prevention (CDC): cdc.gov  American Pregnancy Association: americanpregnancy.org  [place] Department of Agriculture MyPlate: myplate.gov Contact a health care provider if:  Your blood glucose is above your target for two tests in a row.  You have a fever.  You have been sick for 2 days or more and are not getting better.  You have either of these problems for more than 6 hours: ? Vomiting every time you eat or drink. ? Diarrhea. Get help right away if you:  Become confused or cannot think clearly.  Have trouble breathing.  Have moderate or high ketones in your  urine.  Feel your baby is not moving as usual.  Develop unusual discharge or bleeding from your vagina.  Start having early (premature) contractions. Contractions may feel like a tightening in your lower abdomen  Have a severe headache. These symptoms may represent a serious problem that is an emergency. Do not wait to see if the symptoms will go away. Get medical help right away. Call your local emergency services (911 in the [place] Do not drive yourself to the hospital. Summary  Check your blood glucose every day during your pregnancy. Do this as often as told by your health care provider.  Take insulin or other diabetes medicines every day, if your health care provider prescribed them.  Have your blood glucose level checked 4-12 weeks after delivery.  Keep all follow-up visits. This is important. This information is not intended to replace advice given to you by your health care provider. Make sure you discuss any questions you have with your health care provider. Document Revised: 08/27/2019 Document Reviewed: 08/27/2019 Elsevier Patient Education  2021 Elsevier Inc.  

## 2020-07-31 NOTE — Procedures (Signed)
Claudia Lambert [birth year] [redacted]w[redacted]d  Fetus A Non-Stress Test Interpretation for 07/31/20  Indication: IUGR  Fetal Heart Rate A Mode: External Baseline Rate (A): 140 bpm Variability: Moderate Accelerations: 15 x 15 Decelerations: None Multiple birth?: No  Uterine Activity Mode: Palpation,Toco Contraction Frequency (min): none Resting Tone Palpated: Relaxed Resting Time: Adequate  Interpretation (Fetal Testing) Nonstress Test Interpretation: Reactive Overall Impression: Reassuring for gestational age Comments: Dr. Judeth Cornfield reviewed tracing.

## 2020-08-03 NOTE — Progress Notes (Signed)
PRENATAL VISIT NOTE  Subjective:  Claudia Lambert is a 35 y.o. K5G2563 at [redacted]w[redacted]d being seen today for ongoing prenatal care.  She is currently monitored for the following issues for this high-risk pregnancy and has Marijuana use; Supervision of high risk pregnancy, antepartum; History of diet controlled gestational diabetes mellitus (GDM); Asthma; History of cesarean delivery; Poor fetal growth affecting management of mother in second trimester; Abnormal echocardiogram; Large placenta lake; Pelvic pain; and GDM (gestational diabetes mellitus) on their problem list.  Patient reports worried about timing of delivery and getting her support system from [place] to help.  Contractions: Irritability. Vag. Bleeding: None.  Movement: Present. Denies leaking of fluid.   The following portions of the patient's history were reviewed and updated as appropriate: allergies, current medications, past family history, past medical history, past social history, past surgical history and problem list.   Objective:   Vitals:   07/31/20 0924  BP: 137/80  Pulse: (!) 101  Weight: 180 lb 14.4 oz (82.1 kg)    Fetal Status: Fetal Heart Rate (bpm): 141   Movement: Present     General:  Alert, oriented and cooperative. Patient is in no acute distress.  Skin: Skin is warm and dry. No rash noted.   Cardiovascular: Normal heart rate noted  Respiratory: Normal respiratory effort, no problems with respiration noted  Abdomen: Soft, gravid, appropriate for gestational age.  Pain/Pressure: Present     Pelvic: Cervical exam deferred        Extremities: Normal range of motion.  Edema: None  Mental Status: Normal mood and affect. Normal behavior. Normal judgment and thought content.   Assessment and Plan:  Pregnancy: G4P1021 at [redacted]w[redacted]d 1. 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  She has no symptoms from this nor should she.  This is a relatively benign entity.  This will not affect her pregnancy in any way.  There is nothing that needs to be done about this.  I would recommend she repeat an echocardiogram.  We will get a better idea of how large the aneurysm is.  Again this really has no bearing on her longevity or life.  This clearly has no bearing on her pregnancy.  I will notify her of follow-up based on the results of that.  She has no strokelike symptoms or new concerns for that.  She may indeed have a PFO however again this is not a major problem for her.  She had normal LV and RV function on echocardiogram.  I suspect she again will have normal function.  3.  IUGR Impression on 07/31/20  MFM scan  Patient with fetal growth restriction returned for antenatal  testing.  On ultrasound performed on 06/30/2020, the  estimated fetal weight was at the 3rd percentile.  Subsequently on ultrasound performed 3 weeks later, the  estimated fetal weight was at the 11th percentile.  Her blood pressures have been normal at prenatal visits.  Blood pressure today at her office is 122/75 mmHg.  On today's ultrasound, amniotic fluid is normal and good fetal  activity seen.  Umbilical artery Doppler showed normal  forward diastolic flow.  NST is reactive. ---------------------------------------------------------------------- Recommendations  -Continue weekly UA Doppler and NST.  -Weekly BPP from [redacted] weeks gestation till delivery.  -Timing of delivery will be addressed after her next fetal  growth assessment.  Preterm labor  symptoms and general obstetric precautions including but not limited to vaginal bleeding, contractions, leaking of fluid and fetal movement were reviewed in detail with the patient. Please refer to After Visit Summary for other counseling recommendations.   Return in about 1 week (around 08/07/2020).  Future  Appointments  Date Time Provider Department Center  08/05/2020 10:30 AM [facility] NURSE [facility]  08/05/2020 10:45 AM [facility] US6 [facility]  08/05/2020 11:30 AM [facility] NST [facility]  08/15/2020  7:30 AM [facility] NURSE [facility]  08/15/2020  7:45 AM [facility] US4 [facility]  08/15/2020  8:55 AM Burleson, Brand Males, NP [facility]  08/26/2020  2:00 PM Theressa Millard, PT [facility]  08/26/2020  3:15 PM [facility]-EDUCATION [facility]  09/02/2020  8:30 AM Theressa Millard, PT [facility]  09/16/2020  4:00 PM Theressa Millard, PT [facility]  09/23/2020  4:00 PM Theressa Millard, PT [facility]  09/30/2020  4:00 PM Theressa Millard, PT [facility]    Elsie Lincoln, MD

## 2020-08-05 ENCOUNTER — Other Ambulatory Visit: Payer: Self-pay

## 2020-08-05 ENCOUNTER — Other Ambulatory Visit: Payer: Self-pay | Admitting: *Deleted

## 2020-08-05 ENCOUNTER — Other Ambulatory Visit: Payer: Medicaid Other

## 2020-08-05 ENCOUNTER — Other Ambulatory Visit: Payer: Self-pay | Admitting: Maternal & Fetal Medicine

## 2020-08-05 ENCOUNTER — Ambulatory Visit (HOSPITAL_BASED_OUTPATIENT_CLINIC_OR_DEPARTMENT_OTHER): Payer: Medicaid Other | Admitting: *Deleted

## 2020-08-05 ENCOUNTER — Ambulatory Visit (HOSPITAL_BASED_OUTPATIENT_CLINIC_OR_DEPARTMENT_OTHER): Payer: Medicaid Other

## 2020-08-05 ENCOUNTER — Encounter: Payer: Self-pay | Admitting: *Deleted

## 2020-08-05 ENCOUNTER — Ambulatory Visit: Payer: Medicaid Other | Attending: Maternal & Fetal Medicine | Admitting: *Deleted

## 2020-08-05 VITALS — BP 130/79 | HR 100

## 2020-08-05 DIAGNOSIS — Z3A31 31 weeks gestation of pregnancy: Secondary | ICD-10-CM | POA: Insufficient documentation

## 2020-08-05 DIAGNOSIS — O34219 Maternal care for unspecified type scar from previous cesarean delivery: Secondary | ICD-10-CM | POA: Diagnosis not present

## 2020-08-05 DIAGNOSIS — O365930 Maternal care for other known or suspected poor fetal growth, third trimester, not applicable or unspecified: Secondary | ICD-10-CM

## 2020-08-05 DIAGNOSIS — O09293 Supervision of pregnancy with other poor reproductive or obstetric history, third trimester: Secondary | ICD-10-CM | POA: Diagnosis not present

## 2020-08-05 DIAGNOSIS — O09523 Supervision of elderly multigravida, third trimester: Secondary | ICD-10-CM | POA: Insufficient documentation

## 2020-08-05 NOTE — Procedures (Signed)
Claudia Lambert 07-11-1985 [redacted]w[redacted]d  Fetus A Non-Stress Test Interpretation for 08/05/20  Indication: IUGR  Fetal Heart Rate A Mode: External Baseline Rate (A): 140 bpm Variability: Moderate Accelerations: 15 x 15 Decelerations: None Multiple birth?: No  Uterine Activity Mode: Palpation,Toco Contraction Frequency (min): Occas. UI Contraction Quality: Mild Resting Tone Palpated: Relaxed Resting Time: Adequate  Interpretation (Fetal Testing) Nonstress Test Interpretation: Reactive Comments: Dr. Parke Poisson reviewed tracing.

## 2020-08-07 ENCOUNTER — Ambulatory Visit: Payer: Medicaid Other

## 2020-08-07 ENCOUNTER — Other Ambulatory Visit: Payer: Medicaid Other

## 2020-08-14 ENCOUNTER — Ambulatory Visit: Payer: Medicaid Other

## 2020-08-15 ENCOUNTER — Ambulatory Visit (HOSPITAL_BASED_OUTPATIENT_CLINIC_OR_DEPARTMENT_OTHER): Payer: Medicaid Other

## 2020-08-15 ENCOUNTER — Other Ambulatory Visit: Payer: Self-pay

## 2020-08-15 ENCOUNTER — Encounter: Payer: Self-pay | Admitting: *Deleted

## 2020-08-15 ENCOUNTER — Ambulatory Visit (INDEPENDENT_AMBULATORY_CARE_PROVIDER_SITE_OTHER): Payer: Medicaid Other | Admitting: Nurse Practitioner

## 2020-08-15 ENCOUNTER — Ambulatory Visit: Payer: Medicaid Other | Admitting: *Deleted

## 2020-08-15 ENCOUNTER — Ambulatory Visit: Payer: Medicaid Other | Attending: Maternal & Fetal Medicine | Admitting: *Deleted

## 2020-08-15 ENCOUNTER — Other Ambulatory Visit: Payer: Self-pay | Admitting: Maternal & Fetal Medicine

## 2020-08-15 VITALS — BP 118/83 | HR 102 | Wt 185.7 lb

## 2020-08-15 DIAGNOSIS — Z3A32 32 weeks gestation of pregnancy: Secondary | ICD-10-CM

## 2020-08-15 DIAGNOSIS — Z98891 History of uterine scar from previous surgery: Secondary | ICD-10-CM | POA: Diagnosis not present

## 2020-08-15 DIAGNOSIS — O0990 Supervision of high risk pregnancy, unspecified, unspecified trimester: Secondary | ICD-10-CM

## 2020-08-15 DIAGNOSIS — O24410 Gestational diabetes mellitus in pregnancy, diet controlled: Secondary | ICD-10-CM | POA: Diagnosis not present

## 2020-08-15 DIAGNOSIS — O09523 Supervision of elderly multigravida, third trimester: Secondary | ICD-10-CM

## 2020-08-15 DIAGNOSIS — O365931 Maternal care for other known or suspected poor fetal growth, third trimester, fetus 1: Secondary | ICD-10-CM | POA: Diagnosis not present

## 2020-08-15 DIAGNOSIS — O365930 Maternal care for other known or suspected poor fetal growth, third trimester, not applicable or unspecified: Secondary | ICD-10-CM | POA: Diagnosis not present

## 2020-08-15 DIAGNOSIS — O09293 Supervision of pregnancy with other poor reproductive or obstetric history, third trimester: Secondary | ICD-10-CM | POA: Insufficient documentation

## 2020-08-15 DIAGNOSIS — O365920 Maternal care for other known or suspected poor fetal growth, second trimester, not applicable or unspecified: Secondary | ICD-10-CM | POA: Diagnosis not present

## 2020-08-15 DIAGNOSIS — O34219 Maternal care for unspecified type scar from previous cesarean delivery: Secondary | ICD-10-CM | POA: Insufficient documentation

## 2020-08-15 DIAGNOSIS — O283 Abnormal ultrasonic finding on antenatal screening of mother: Secondary | ICD-10-CM | POA: Insufficient documentation

## 2020-08-15 DIAGNOSIS — R102 Pelvic and perineal pain: Secondary | ICD-10-CM

## 2020-08-15 LAB — POCT URINALYSIS DIP (DEVICE)
Bilirubin Urine: NEGATIVE
Glucose, UA: 100 mg/dL — AB
Hgb urine dipstick: NEGATIVE
Ketones, ur: NEGATIVE mg/dL
Leukocytes,Ua: NEGATIVE
Nitrite: NEGATIVE
Protein, ur: NEGATIVE mg/dL
Specific Gravity, Urine: >=1.03 (ref 1.005–1.030)
Urobilinogen, UA: 0.2 mg/dL (ref 0.0–1.0)
pH: 7 (ref 5.0–8.0)

## 2020-08-15 LAB — GLUCOSE, CAPILLARY: Glucose-Capillary: 99 mg/dL (ref 70–99)

## 2020-08-15 NOTE — Progress Notes (Signed)
Subjective:  Claudia Lambert is a 35 y.o. [id number] at [redacted]w[redacted]d being seen today for ongoing prenatal care.  She is currently monitored for the following issues for this high-risk pregnancy and has Marijuana use; Supervision of high risk pregnancy, antepartum; History of diet controlled gestational diabetes mellitus (GDM); Asthma; History of cesarean delivery; Poor fetal growth affecting management of mother in second trimester; Abnormal echocardiogram; Large placenta lake; Pelvic pain; and GDM (gestational diabetes mellitus) on their problem list.  Patient reports continued pelvic pain.  Contractions: Irritability. Vag. Bleeding: None.  Movement: Present. Denies leaking of fluid.   The following portions of the patient's history were reviewed and updated as appropriate: allergies, current medications, past family history, past medical history, past social history, past surgical history and problem list. Problem list updated.  Objective:   Vitals:   08/15/20 0917  BP: 118/83  Pulse: (!) 102  Weight: 185 lb 11.2 oz (84.2 kg)    Fetal Status: Fetal Heart Rate (bpm): NST Fundal Height: 31 cm Movement: Present     General:  Alert, oriented and cooperative. Patient is in no acute distress.  Skin: Skin is warm and dry. No rash noted.   Cardiovascular: Normal heart rate noted  Respiratory: Normal respiratory effort, no problems with respiration noted  Abdomen: Soft, gravid, appropriate for gestational age. Pain/Pressure: Present     Pelvic:  Cervical exam deferred        Extremities: Normal range of motion.  Edema: None  Mental Status: Normal mood and affect. Normal behavior. Normal judgment and thought content.   Urinalysis:      Assessment and Plan:  Pregnancy: G4P1021 at [redacted]w[redacted]d  1. Supervision of high risk pregnancy, antepartum Seeing MGM weekly Baby is moving well Does not want BTL - advised to look at other options for contraception and bring her questions - no questions today Her  mother who lives in [place] is coming next week to stay with her until the delivery Advised PAP results not received from [place] so will plan to do PP Pap smear as she will need in person appt and also repeat of 2 hr glucola PP  2. Poor fetal growth affecting management of mother in second trimester, single or unspecified fetus [place] today - biophysical profile score 8 out of 10.  Doppler studies of the umbilical arteries showed a normal  S/D ratio of 2.71.  There were no signs of absent or reversed end-diastolic flow Advised baby is gaining weight, but need documentation of blood sugars so we can determine whether baby is gaining due to high glucose levels.  3. Diet controlled gestational diabetes mellitus (GDM) in third trimester appt is May 24th for diabetes educator Had gestational diabetes with last pregnancy Has not checked blood sugars with this pregnancy and very little with last pregnancy Today had tea for breakfast and random glucose is 99. Advised to check sugars and enter into babyscripts which she has on her phone  4.  Pelvic pain Using pelvic support band Seeing PT but next appt is May 24 Doing exercises at home which help but still having pain  5.  Cesarean Section previously - placenta previa Will meet with MD for delivery plan Encouraged to sign up for childbirth classes to get more info on managing pain in labor  Preterm labor symptoms and general obstetric precautions including but not limited to vaginal bleeding, contractions, leaking of fluid and fetal movement were reviewed in detail with the patient. Please refer to After Visit  Summary for other counseling recommendations.  Return in about 2 weeks (around 08/29/2020) for [facility] - MD for delivery plan.  Nolene Bernheim, RN, MSN, NP-BC Nurse Practitioner, [facility] for [employer], [facility] 08/15/2020 9:55 AM

## 2020-08-15 NOTE — Procedures (Signed)
Claudia Lambert Jun 16, 1985 [redacted]w[redacted]d  Fetus A Non-Stress Test Interpretation for 08/15/20  Indication: Unsatisfactory BPP  Fetal Heart Rate A Mode: External Baseline Rate (A): 140 bpm Variability: Moderate Accelerations: 15 x 15 Decelerations: None Multiple birth?: No  Uterine Activity Mode: Palpation,Toco Contraction Frequency (min): none Resting Tone Palpated: Relaxed Resting Time: Adequate  Interpretation (Fetal Testing) Nonstress Test Interpretation: Reactive Overall Impression: Reassuring for gestational age Comments: Dr. Parke Poisson reviewed tracing

## 2020-08-15 NOTE — Patient Instructions (Signed)
Website: [email or web address]  [email or web address]

## 2020-08-22 ENCOUNTER — Other Ambulatory Visit: Payer: Self-pay | Admitting: Obstetrics

## 2020-08-22 ENCOUNTER — Encounter: Payer: Self-pay | Admitting: *Deleted

## 2020-08-22 ENCOUNTER — Ambulatory Visit: Payer: Medicaid Other | Attending: Obstetrics

## 2020-08-22 ENCOUNTER — Ambulatory Visit (HOSPITAL_BASED_OUTPATIENT_CLINIC_OR_DEPARTMENT_OTHER): Payer: Medicaid Other | Admitting: *Deleted

## 2020-08-22 ENCOUNTER — Ambulatory Visit: Payer: Medicaid Other | Admitting: *Deleted

## 2020-08-22 ENCOUNTER — Other Ambulatory Visit: Payer: Self-pay

## 2020-08-22 DIAGNOSIS — O365930 Maternal care for other known or suspected poor fetal growth, third trimester, not applicable or unspecified: Secondary | ICD-10-CM

## 2020-08-22 DIAGNOSIS — O0990 Supervision of high risk pregnancy, unspecified, unspecified trimester: Secondary | ICD-10-CM | POA: Insufficient documentation

## 2020-08-22 DIAGNOSIS — O24419 Gestational diabetes mellitus in pregnancy, unspecified control: Secondary | ICD-10-CM | POA: Diagnosis not present

## 2020-08-22 DIAGNOSIS — Z3A33 33 weeks gestation of pregnancy: Secondary | ICD-10-CM

## 2020-08-22 NOTE — Procedures (Signed)
Claudia Lambert Feb 16, 1986 [redacted]w[redacted]d  Fetus A Non-Stress Test Interpretation for 08/22/20  Indication: Diabetes and Unsatisfactory BPP  Fetal Heart Rate A Mode: External Baseline Rate (A): 130 bpm Variability: Moderate Accelerations: 15 x 15 Decelerations: None Multiple birth?: No  Uterine Activity Mode: Palpation,Toco Contraction Frequency (min): Occas. Contraction Quality: Mild Resting Tone Palpated: Relaxed Resting Time: Adequate  Interpretation (Fetal Testing) Nonstress Test Interpretation: Reactive Comments: Dr. Grace Bushy reviewed tracing.

## 2020-08-26 ENCOUNTER — Encounter: Payer: Medicaid Other | Attending: Obstetrics and Gynecology | Admitting: Physical Therapy

## 2020-08-26 ENCOUNTER — Ambulatory Visit: Payer: Medicaid Other | Admitting: Registered"

## 2020-08-26 ENCOUNTER — Other Ambulatory Visit: Payer: Self-pay

## 2020-08-26 ENCOUNTER — Encounter: Payer: Self-pay | Admitting: Physical Therapy

## 2020-08-26 ENCOUNTER — Encounter: Payer: Medicaid Other | Attending: Obstetrics & Gynecology | Admitting: Registered"

## 2020-08-26 DIAGNOSIS — M899 Disorder of bone, unspecified: Secondary | ICD-10-CM | POA: Diagnosis present

## 2020-08-26 DIAGNOSIS — R252 Cramp and spasm: Secondary | ICD-10-CM

## 2020-08-26 DIAGNOSIS — M545 Low back pain, unspecified: Secondary | ICD-10-CM

## 2020-08-26 DIAGNOSIS — O0993 Supervision of high risk pregnancy, unspecified, third trimester: Secondary | ICD-10-CM | POA: Diagnosis not present

## 2020-08-26 DIAGNOSIS — O24419 Gestational diabetes mellitus in pregnancy, unspecified control: Secondary | ICD-10-CM | POA: Insufficient documentation

## 2020-08-26 DIAGNOSIS — M6281 Muscle weakness (generalized): Secondary | ICD-10-CM

## 2020-08-26 DIAGNOSIS — Z3A34 34 weeks gestation of pregnancy: Secondary | ICD-10-CM | POA: Diagnosis not present

## 2020-08-26 NOTE — Progress Notes (Signed)
Patient was seen on 08/26/20 for Gestational Diabetes self-management. EDD 10/07/2020; [redacted]w[redacted]d. Patient states prior history of GDM. Diet history obtained. Patient eats variety of all food groups. Pt states she has cut back on carbs such as grits, some fruits, and fried foods. Pt states she was eating pretty healthy prior to GDM diagnosis but portions sizes of some carbs (fruit mostly) may have been to large. Beverages include water.  Patient states she has been following guidelines learned from last pregnancy with GDM and states her blood sugar has been staying in control. Patient reports she is active with PT, stretching, and walking.  The following learning objectives were met by the patient :   States the definition of Gestational Diabetes  States why dietary management is important in controlling blood glucose  Describes the effects of carbohydrates on blood glucose levels  Demonstrates ability to create a balanced meal plan  Demonstrates carbohydrate counting   States when to check blood glucose levels  Demonstrates proper blood glucose monitoring techniques  States the effect of stress and exercise on blood glucose levels  States the importance of limiting caffeine and abstaining from alcohol and smoking  Plan:  Aim for 3 Carbohydrate Choices per meal (45 grams) +/- 1 either way  Aim for 1-2 Carbohydrate Choices per snack Begin reading food labels for Total Carbohydrate of foods If OK with your MD, consider  increasing your activity level by walking, Arm Chair Exercises or other activity daily as tolerated Begin checking Blood Glucose before breakfast and 2 hours after first bite of breakfast, lunch and dinner as directed by MD  Bring Log Book/Sheet and meter to every medical appointment  Take medication if directed by MD  Patient already has a meter, is testing pre breakfast and 2 hours after each meal. Patient is not recording blood sugar   Patient instructed to monitor  glucose levels: FBS: 60 - 95 mg/dl 2 hour: <120 mg/dl  Patient received the following handouts:  Nutrition Diabetes and Pregnancy  Carbohydrate Counting List  Blood glucose Log Sheet  Patient will be seen for follow-up as needed.

## 2020-08-26 NOTE — Patient Instructions (Signed)
Access Code: [email or web address] URL: [email or web address] Date: 08/26/2020 Prepared by: Eulis Foster  Exercises Quadruped Cat Camel - 2 x daily - 7 x weekly - 1 sets - 10 reps Hip Hinge Rock Back - 2 x daily - 7 x weekly - 1 sets - 10 reps Standing Posterior Pelvic Tilt - 3 x daily - 7 x weekly - 1 sets - 10 reps Quadruped Thoracic Rotation Full Range with Hand on Neck - 1 x daily - 7 x weekly - 1 sets - 5 reps Child's Pose with Thread the Needle - 1 x daily - 7 x weekly - 1 sets - 5 reps Sidelying Open Book Thoracic Rotation with Knee on Foam Roll - 1 x daily - 7 x weekly - 1 sets - 10 reps Google with Blocks Underneath Hands, Knees Apart, and Hands Forward - 1 x daily - 7 x weekly - 1 sets - 1 reps Standing 'L' Stretch at Counter - 1 x daily - 7 x weekly - 1 sets - 1 reps Sidelying Anterior Pelvic Tilts - 1 x daily - 7 x weekly - 1 sets - 10 reps  Eulis Foster, PT [facility] [address], [address] [place], [place] [address] W: [phone] [email or web address]

## 2020-08-26 NOTE — Therapy (Signed)
[facility] at [facility] [address] [place], [place], [address] Phone: [phone]   Fax:  [phone]  Physical Therapy Treatment  Patient Details  Name: Claudia Lambert MRN: [id number] Date of Birth: [birth year] Referring Provider (PT): Thressa Sheller CNM   Encounter Date: 08/26/2020   PT End of Session - 08/26/20 1411    Visit Number 4    Date for PT Re-Evaluation 09/24/20    Authorization Type [employer]    Authorization Time Period 4/18-6/22    Authorization - Visit Number 3    Authorization - Number of Visits 12    PT Start Time 1400    PT Stop Time 1445    PT Time Calculation (min) 45 min    Activity Tolerance Patient tolerated treatment well    Behavior During Therapy [facility] for tasks assessed/performed           Past Medical History:  Diagnosis Date  . Asthma   . Eczema   . GDM (gestational diabetes mellitus) 07/16/2020  . Infection    UTI    Past Surgical History:  Procedure Laterality Date  . CESAREAN SECTION     Placenta Previa    There were no vitals filed for this visit.   Subjective Assessment - 08/26/20 1407    Subjective I am scheduled around 6/19.    Patient Stated Goals manage pain    Currently in Pain? Yes    Pain Score 9     Pain Location Pelvis    Pain Orientation Mid    Pain Descriptors / Indicators Pressure    Pain Type Acute pain    Pain Onset More than a month ago    Pain Frequency Constant    Aggravating Factors  in bed to get out, sitting down and standing up,    Pain Relieving Factors pulling on leg, sitting on ball to stretch    Pain Score 7    Pain Location Hip    Pain Orientation Lower    Pain Descriptors / Indicators Dull;Sharp    Pain Type Acute pain    Pain Onset More than a month ago    Pain Frequency Intermittent    Aggravating Factors  walking,    Pain Relieving Factors pull on hip                             [facility] Adult PT Treatment/Exercise -  08/26/20 0001      Therapeutic Activites    Therapeutic Activities ADL's    Other Therapeutic Activities rolling in bed with pillow between knees, sidely to sitting with pillow between knees to reduce the strain; laying on back with feet and hips elevated to reduce the pressure on the pelvis and do pelvic floor contraction; coughing  and sneezing with pillow to reduce the strain on c-section after birth      Lumbar Exercises: Sidelying   Other Sidelying Lumbar Exercises pelvic sway and tilt to  improve pelvic mobility      Manual Therapy   Manual Therapy Soft tissue mobilization;Joint mobilization    Joint Mobilization gpping of L1-L5 to improve mobility    Soft tissue mobilization manual work to the suprapubic area while supine with hips elevated to reduce the pelvic pressure; manual work to the hip adductors and levator ani and along the pubic bone  PT Education - 08/26/20 1453    Education Details Access Code: [email or web address]    Person(s) Educated Patient    Methods Explanation;Demonstration;Verbal cues;Handout    Comprehension Returned demonstration;Verbalized understanding            PT Short Term Goals - 07/29/20 1507      PT SHORT TERM GOAL #1   Title independent with initial HEP    Time 4    Period Weeks    Status Achieved             PT Long Term Goals - 08/26/20 1457      PT LONG TERM GOAL #1   Title indepedent with advanced HEP to manage her pain while being pregnant    Time 12    Period Weeks    Status On-going      PT LONG TERM GOAL #2   Title able to use the belly band for keeping her SI joint in correct alignment    Time 12    Period Weeks    Status Achieved      PT LONG TERM GOAL #3   Title understand how to manuever in bed and with correct body mechanics to reduce strain on the symphysis pubic bone and back    Time 12    Period Weeks    Status Achieved      PT LONG TERM GOAL #4   Title able to manage her pain around 4/10  instead of 7/10 due to increased bilateral hip strength to >/= 4/10    Time 12    Period Weeks    Status On-going      PT LONG TERM GOAL #5   Title understand ways to give birth to not strain her back and symphysis pubis    Time 12    Period Weeks    Status On-going                 Plan - 08/26/20 1454    Clinical Impression Statement Patient has increased pain and pressure in the hip and pelvic area. She has decreased L1-L5 movement and the right ilium was anteriorly rotated. After therapy the pelvis in correct alignment and improve lumbar mobility. Patient is being educated on how to move in bed prior and after pregnancy, how to contract the pelvic floor and perform daily tasks to decrease strain. She had alot of tenderness in the suprapubic area and left hip adductor. Patient will benefit from skilled therapy to reduce her pain to a managble level throughout her pregnancies.    Personal Factors and Comorbidities Comorbidity 2;Fitness    Comorbidities pregnant and has had a prior c-section; history of placent pervia    Examination-Activity Limitations Locomotion Level;Transfers;Caring for Others;Carry;Dressing;Lift;Stand;Sit;Sleep    Examination-Participation Restrictions Meal Prep;Cleaning;Community Activity;Driving;Laundry    Stability/Clinical Decision Making Evolving/Moderate complexity    Rehab Potential Excellent    PT Frequency 1x / week    PT Duration 12 weeks    PT Treatment/Interventions ADLs/Self Care Home Management;Cryotherapy;Moist Heat;Therapeutic activities;Therapeutic exercise;Neuromuscular re-education;Manual techniques;Patient/family education;Joint Manipulations;Spinal Manipulations;Taping    PT Next Visit Plan work on gluteal contraction; SI stability exercises; manual work to the muscles of the levator ani on the left; hip adductors, and lumbar paraspinals    PT Home Exercise Plan Access Code: [email or web address]    Consulted and Agree with Plan of Care Patient            Patient will benefit from skilled therapeutic intervention in order to improve the  following deficits and impairments:  Decreased range of motion,Increased fascial restricitons,Pain,Decreased strength,Decreased mobility,Decreased activity tolerance,Decreased endurance,Increased muscle spasms  Visit Diagnosis: Muscle weakness (generalized)  Cramp and spasm  Acute bilateral low back pain without sciatica  Pubic bone pain     Problem List Patient Active Problem List   Diagnosis Date Noted  . GDM (gestational diabetes mellitus) 07/16/2020  . Large placenta lake 07/14/2020  . Pelvic pain 07/14/2020  . Abnormal echocardiogram 06/12/2020  . History of cesarean delivery 06/11/2020  . Poor fetal growth affecting management of mother in second trimester 06/11/2020  . Supervision of high risk pregnancy, antepartum 04/10/2020  . History of diet controlled gestational diabetes mellitus (GDM) 04/10/2020  . Asthma 04/10/2020  . Marijuana use 03/04/2020    Eulis Foster, PT 08/26/20 2:59 PM   [facility] [address] [place], [place], [address] Phone: [phone]   Fax:  [phone]  Name: Claudia Lambert MRN: [id number] Date of Birth: May 27, 1985

## 2020-08-28 ENCOUNTER — Other Ambulatory Visit: Payer: Self-pay

## 2020-08-29 ENCOUNTER — Other Ambulatory Visit: Payer: Self-pay

## 2020-08-29 ENCOUNTER — Encounter: Payer: Self-pay | Admitting: *Deleted

## 2020-08-29 ENCOUNTER — Other Ambulatory Visit: Payer: Self-pay | Admitting: Obstetrics

## 2020-08-29 ENCOUNTER — Ambulatory Visit: Payer: Medicaid Other | Attending: Obstetrics

## 2020-08-29 ENCOUNTER — Ambulatory Visit: Payer: Medicaid Other | Admitting: *Deleted

## 2020-08-29 DIAGNOSIS — O24419 Gestational diabetes mellitus in pregnancy, unspecified control: Secondary | ICD-10-CM

## 2020-08-29 DIAGNOSIS — O0990 Supervision of high risk pregnancy, unspecified, unspecified trimester: Secondary | ICD-10-CM | POA: Insufficient documentation

## 2020-08-29 DIAGNOSIS — O365930 Maternal care for other known or suspected poor fetal growth, third trimester, not applicable or unspecified: Secondary | ICD-10-CM | POA: Diagnosis not present

## 2020-08-29 NOTE — Procedures (Addendum)
Claudia Lambert 03-02-1986 [redacted]w[redacted]d  Fetus A Non-Stress Test Interpretation for 08/29/20  Indication: Unsatisfactory BPP  Fetal Heart Rate A Mode: External Baseline Rate (A): 135 bpm Variability: Moderate Accelerations: 15 x 15 Decelerations: None Multiple birth?: No  Uterine Activity Mode: Palpation,Toco Contraction Frequency (min): None Resting Tone Palpated: Relaxed Resting Time: Adequate  Interpretation (Fetal Testing) Nonstress Test Interpretation: Reactive Comments: Dr. Parke Poisson reviewed tracing.  (No note.)

## 2020-09-02 ENCOUNTER — Encounter: Payer: Medicaid Other | Admitting: Physical Therapy

## 2020-09-02 ENCOUNTER — Encounter: Payer: Self-pay | Admitting: Physical Therapy

## 2020-09-02 ENCOUNTER — Ambulatory Visit (INDEPENDENT_AMBULATORY_CARE_PROVIDER_SITE_OTHER): Payer: Medicaid Other | Admitting: Certified Nurse Midwife

## 2020-09-02 ENCOUNTER — Other Ambulatory Visit: Payer: Self-pay

## 2020-09-02 VITALS — BP 128/86 | HR 108 | Wt 187.7 lb

## 2020-09-02 DIAGNOSIS — M6281 Muscle weakness (generalized): Secondary | ICD-10-CM | POA: Diagnosis not present

## 2020-09-02 DIAGNOSIS — M545 Low back pain, unspecified: Secondary | ICD-10-CM

## 2020-09-02 DIAGNOSIS — R102 Pelvic and perineal pain: Secondary | ICD-10-CM

## 2020-09-02 DIAGNOSIS — O0993 Supervision of high risk pregnancy, unspecified, third trimester: Secondary | ICD-10-CM

## 2020-09-02 DIAGNOSIS — Z3A35 35 weeks gestation of pregnancy: Secondary | ICD-10-CM

## 2020-09-02 DIAGNOSIS — M899 Disorder of bone, unspecified: Secondary | ICD-10-CM

## 2020-09-02 DIAGNOSIS — O24410 Gestational diabetes mellitus in pregnancy, diet controlled: Secondary | ICD-10-CM

## 2020-09-02 DIAGNOSIS — R252 Cramp and spasm: Secondary | ICD-10-CM

## 2020-09-02 DIAGNOSIS — O26893 Other specified pregnancy related conditions, third trimester: Secondary | ICD-10-CM

## 2020-09-02 MED ORDER — CYCLOBENZAPRINE HCL 5 MG PO TABS: 5.0000 mg | ORAL_TABLET | Freq: Every evening | ORAL | 0 refills | Status: DC | PRN

## 2020-09-02 MED ORDER — MAG-OXIDE 200 MG PO TABS: 400.0000 mg | ORAL_TABLET | Freq: Every day | ORAL | 3 refills | Status: DC

## 2020-09-02 NOTE — Patient Instructions (Signed)

## 2020-09-02 NOTE — Patient Instructions (Addendum)
Access Code: [email or web address] URL: [email or web address] Date: 09/02/2020 Prepared by: Eulis Foster  Exercises Seated Hamstring Curl with Anchored Resistance - 1 x daily - 7 x weekly - 2 sets - 10 reps Standing Pelvic Tilt to Neutral Spine at Wall - 1 x daily - 7 x weekly - 1 sets - 10 reps  Eulis Foster, PT [facility] [address], [address] [place], [place] [address] W: [phone] [email or web address]

## 2020-09-02 NOTE — Progress Notes (Signed)
PRENATAL VISIT NOTE  Subjective:  Claudia Lambert is a 35 y.o. [id number] at [redacted]w[redacted]d being seen today for ongoing prenatal care.  She is currently monitored for the following issues for this high-risk pregnancy and has Marijuana use; Supervision of high risk pregnancy, antepartum; History of diet controlled gestational diabetes mellitus (GDM); Asthma; History of cesarean delivery; Poor fetal growth affecting management of mother in second trimester; Abnormal echocardiogram; Large placenta lake; Pelvic pain affecting pregnancy in third trimester, antepartum; and GDM (gestational diabetes mellitus) on their problem list.  Patient reports severe pubic symphysis pain, worst with movement and walking. Has not tried any medications for it; wears a pregnancy belt occasionally.  Contractions: Irritability. Vag. Bleeding: None.  Movement: Present. Denies leaking of fluid.   The following portions of the patient's history were reviewed and updated as appropriate: allergies, current medications, past family history, past medical history, past social history, past surgical history and problem list.   Objective:   Vitals:   09/02/20 1107  BP: 128/86  Pulse: (!) 108  Weight: 187 lb 11.2 oz (85.1 kg)    Fetal Status: Fetal Heart Rate (bpm): 147 Fundal Height: 34 cm Movement: Present     General:  Alert, oriented and cooperative. Patient is in no acute distress.  Skin: Skin is warm and dry. No rash noted.   Cardiovascular: Normal heart rate noted  Respiratory: Normal respiratory effort, no problems with respiration noted  Abdomen: Soft, gravid, appropriate for gestational age.  Pain/Pressure: Present     Pelvic: Cervical exam deferred        Extremities: Normal range of motion.  Edema: None  Mental Status: Normal mood and affect. Normal behavior. Normal judgment and thought content.   Assessment and Plan:  Pregnancy: G4P1021 at [redacted]w[redacted]d 1. Supervision of high risk pregnancy in third trimester - Feeling  vigorous fetal movements - All imaging reviewed, good fetal growth noted  2. [redacted] weeks gestation of pregnancy - Routine OB care - Does not want to VBAC, prefers repeat CS as early as possible due to pelvic pain  3. Diet controlled gestational diabetes mellitus (GDM) in third trimester - Fasting readings all high except one (only 4 days of testing noted), post-prandials normal except one (pt ate Raisin Bran).  - Often falls asleep at night before 2hrs pass for testing. Also frequently skipping breakfast. Spoke at length about importance of not skipping meals, discussed the relationship between skipping meals and high fasting numbers. Encouraged to eat frequently throughout the day, always with protein.  - Brainstormed some easy breakfast foods as well as ways to improve current meals to avoid glucose spikes  4. Pelvic pain affecting pregnancy in third trimester, antepartum - Encouraged to use pregnancy belt whenever standing, can ice mons pubis and take epsom salt baths. Does not like taking medications, but amenable to magnesium with low dose flexeril. - Magnesium Oxide (MAG-OXIDE) 200 MG TABS; Take 2 tablets (400 mg total) by mouth at bedtime. If that amount causes loose stools in the am, switch to 200mg  daily at bedtime.  Dispense: 60 tablet; Refill: 3 - cyclobenzaprine (FLEXERIL) 5 MG tablet; Take 1 tablet (5 mg total) by mouth at bedtime as needed for muscle spasms.  Dispense: 45 tablet; Refill: 0  Preterm labor symptoms and general obstetric precautions including but not limited to vaginal bleeding, contractions, leaking of fluid and fetal movement were reviewed in detail with the patient. Please refer to After Visit Summary for other counseling recommendations.   Return in about 1  week (around 09/09/2020) for IN-PERSON, LOB w GBS.  Future Appointments  Date Time Provider Department Center  09/05/2020  7:45 AM [facility] NURSE [facility]  09/05/2020  8:00 AM [facility] US1 [facility]  09/11/2020   3:35 PM Alric Seton, MD [facility]  09/16/2020  4:00 PM Theressa Millard, PT [facility]  09/23/2020  4:00 PM Theressa Millard, PT [facility]  09/30/2020  4:00 PM Theressa Millard, PT [facility]    Bernerd Limbo, CNM

## 2020-09-02 NOTE — Therapy (Signed)
[facility] at [facility] [address] [place], [place], [address] Phone: [phone]   Fax:  [phone]  Physical Therapy Treatment  Patient Details  Name: Claudia Lambert MRN: [id number] Date of Birth: Jul 17, 1985 Referring Provider (PT): Thressa Sheller CNM   Encounter Date: 09/02/2020   PT End of Session - 09/02/20 0915    Visit Number 5    Date for PT Re-Evaluation 09/24/20    Authorization Type [employer]    Authorization Time Period 4/18-6/22    Authorization - Visit Number 4    Authorization - Number of Visits 12    PT Start Time 0830    PT Stop Time 0915    PT Time Calculation (min) 45 min    Activity Tolerance Patient tolerated treatment well    Behavior During Therapy [facility] for tasks assessed/performed           Past Medical History:  Diagnosis Date  . Asthma   . Eczema   . GDM (gestational diabetes mellitus) 07/16/2020  . Infection    UTI    Past Surgical History:  Procedure Laterality Date  . CESAREAN SECTION     Placenta Previa    There were no vitals filed for this visit.   Subjective Assessment - 09/02/20 0834    Subjective The pain is getting worse. I have been massaging and stretching. I had  trouble to ge out of the bed. I have alot of pain in the pelvic floor and and groin area. I am [redacted] weeks along. Physical therapy give me relief.    Patient Stated Goals manage pain    Currently in Pain? Yes    Pain Score 10-Worst pain ever    Pain Location Pelvis    Pain Orientation Mid    Pain Descriptors / Indicators Pressure    Pain Type Acute pain    Pain Onset More than a month ago    Pain Frequency Constant    Aggravating Factors  getting up from laying down position, getting out of the car,    Pain Relieving Factors pulling on leg, stting on ball to stretch    Multiple Pain Sites No              [facility] PT Assessment - 09/02/20 0001      AROM   Overall AROM Comments lumbar flexion with left  ASIS does not move and pelvis rotated left                         [facility] Adult PT Treatment/Exercise - 09/02/20 0001      Lumbar Exercises: Standing   Other Standing Lumbar Exercises stand with pelvic tilt against the wall with hands  assisting the belly      Lumbar Exercises: Seated   Other Seated Lumbar Exercises sit on mat wiht knee flexion resistance of red band 2x10 to contract the gluteals      Manual Therapy   Manual Therapy Joint mobilization;Soft tissue mobilization;Muscle Energy Technique    Joint Mobilization gpping of L1-L5 to improve mobilityT4-T10 mobilization to improve mobility,    Soft tissue mobilization manual work to the sypmhysis pubis, left SI joint, Left quadratus, bil.ateral levator ani    Muscle Energy Technique to correct left ilium                  PT Education - 09/02/20 0915    Education Details Access Code: [email or web address]  Person(s) Educated Patient    Methods Explanation;Demonstration;Verbal cues;Handout    Comprehension Returned demonstration;Verbalized understanding            PT Short Term Goals - 07/29/20 1507      PT SHORT TERM GOAL #1   Title independent with initial HEP    Time 4    Period Weeks    Status Achieved             PT Long Term Goals - 09/02/20 0920      PT LONG TERM GOAL #1   Title indepedent with advanced HEP to manage her pain while being pregnant    Time 12    Period Weeks    Status On-going      PT LONG TERM GOAL #2   Title able to use the belly band for keeping her SI joint in correct alignment    Baseline has used it on occasion    Time 12    Period Weeks    Status Achieved      PT LONG TERM GOAL #3   Title understand how to manuever in bed and with correct body mechanics to reduce strain on the symphysis pubic bone and back    Baseline does well with this    Time 12    Period Weeks    Status Achieved      PT LONG TERM GOAL #4   Title able to manage her pain around 4/10 instead  of 7/10 due to increased bilateral hip strength to >/= 4/10    Baseline today was 10/10 and after therapy decreased to 7/10    Time 12    Period Weeks    Status On-going      PT LONG TERM GOAL #5   Title understand ways to give birth to not strain her back and symphysis pubis    Baseline understands for c-section    Time 12    Period Weeks    Status Achieved                 Plan - 09/02/20 0916    Clinical Impression Statement Patient is having increased pelvic pain that will reduce for 1 day with therapy. Today her pain decreased from 10/10 to 7/10. Patient had decreased mobility of the lumbar spine at L3 and left SI joint. Her levator ani and symphysis pubis was very tender. She had a reduction of waddling gait after therapy. Paitent is [redacted] weeks along. She will benefit from skilled therapy to reduce her pain to a managable level throughout her pregnancy.    Personal Factors and Comorbidities Comorbidity 2;Fitness    Comorbidities pregnant and has had a prior c-section; history of placent pervia    Examination-Activity Limitations Locomotion Level;Transfers;Caring for Others;Carry;Dressing;Lift;Stand;Sit;Sleep    Examination-Participation Restrictions Meal Prep;Cleaning;Community Activity;Driving;Laundry    Stability/Clinical Decision Making Evolving/Moderate complexity    Rehab Potential Excellent    PT Frequency 1x / week    PT Duration 12 weeks    PT Treatment/Interventions ADLs/Self Care Home Management;Cryotherapy;Moist Heat;Therapeutic activities;Therapeutic exercise;Neuromuscular re-education;Manual techniques;Patient/family education;Joint Manipulations;Spinal Manipulations;Taping    PT Next Visit Plan work on gluteal contraction; SI stability exercises; manual work to the muscles of the levator ani and quadratus; manual mobilization to lumbar and muscle energy to the pelvis.    PT Home Exercise Plan Access Code: [email or web address]    Consulted and Agree with Plan of Care Patient            Patient will benefit  from skilled therapeutic intervention in order to improve the following deficits and impairments:  Decreased range of motion,Increased fascial restricitons,Pain,Decreased strength,Decreased mobility,Decreased activity tolerance,Decreased endurance,Increased muscle spasms  Visit Diagnosis: Muscle weakness (generalized)  Cramp and spasm  Acute bilateral low back pain without sciatica  Pubic bone pain     Problem List Patient Active Problem List   Diagnosis Date Noted  . GDM (gestational diabetes mellitus) 07/16/2020  . Large placenta lake 07/14/2020  . Pelvic pain 07/14/2020  . Abnormal echocardiogram 06/12/2020  . History of cesarean delivery 06/11/2020  . Poor fetal growth affecting management of mother in second trimester 06/11/2020  . Supervision of high risk pregnancy, antepartum 04/10/2020  . History of diet controlled gestational diabetes mellitus (GDM) 04/10/2020  . Asthma 04/10/2020  . Marijuana use 03/04/2020    Eulis Foster, PT 09/02/20 9:22 AM   [place] [facility] at [facility] [address] [place], [place], [address] Phone: [phone]   Fax:  [phone]  Name: Claudia Lambert MRN: [id number] Date of Birth: [birth year]

## 2020-09-05 ENCOUNTER — Ambulatory Visit: Payer: Medicaid Other | Attending: Obstetrics

## 2020-09-05 ENCOUNTER — Other Ambulatory Visit: Payer: Self-pay

## 2020-09-05 ENCOUNTER — Ambulatory Visit: Payer: Medicaid Other | Admitting: *Deleted

## 2020-09-05 ENCOUNTER — Encounter: Payer: Self-pay | Admitting: *Deleted

## 2020-09-05 ENCOUNTER — Other Ambulatory Visit: Payer: Self-pay | Admitting: *Deleted

## 2020-09-05 ENCOUNTER — Other Ambulatory Visit: Payer: Self-pay | Admitting: Obstetrics

## 2020-09-05 DIAGNOSIS — O283 Abnormal ultrasonic finding on antenatal screening of mother: Secondary | ICD-10-CM | POA: Diagnosis present

## 2020-09-05 DIAGNOSIS — O365930 Maternal care for other known or suspected poor fetal growth, third trimester, not applicable or unspecified: Secondary | ICD-10-CM

## 2020-09-05 DIAGNOSIS — Z3A35 35 weeks gestation of pregnancy: Secondary | ICD-10-CM

## 2020-09-05 DIAGNOSIS — O0990 Supervision of high risk pregnancy, unspecified, unspecified trimester: Secondary | ICD-10-CM | POA: Insufficient documentation

## 2020-09-05 DIAGNOSIS — O09523 Supervision of elderly multigravida, third trimester: Secondary | ICD-10-CM

## 2020-09-05 DIAGNOSIS — O09293 Supervision of pregnancy with other poor reproductive or obstetric history, third trimester: Secondary | ICD-10-CM | POA: Diagnosis not present

## 2020-09-05 DIAGNOSIS — O34219 Maternal care for unspecified type scar from previous cesarean delivery: Secondary | ICD-10-CM | POA: Diagnosis not present

## 2020-09-05 NOTE — Procedures (Signed)
Claudia Lambert Aug 27, 1985 [redacted]w[redacted]d  Fetus A Non-Stress Test Interpretation for 09/05/20  Indication: Unsatisfactory BPP  Fetal Heart Rate A Mode: External Baseline Rate (A): 145 bpm Variability: Moderate Accelerations: 15 x 15 Decelerations: None Multiple birth?: No  Uterine Activity Mode: Palpation,Toco Contraction Frequency (min): none Resting Tone Palpated: Relaxed Resting Time: Adequate  Interpretation (Fetal Testing) Nonstress Test Interpretation: Reactive Overall Impression: Reassuring for gestational age Comments: Dr. Judeth Cornfield reviewed tracing

## 2020-09-11 ENCOUNTER — Ambulatory Visit (INDEPENDENT_AMBULATORY_CARE_PROVIDER_SITE_OTHER): Payer: Medicaid Other | Admitting: Obstetrics and Gynecology

## 2020-09-11 ENCOUNTER — Other Ambulatory Visit: Payer: Self-pay

## 2020-09-11 ENCOUNTER — Other Ambulatory Visit (HOSPITAL_COMMUNITY)
Admission: RE | Admit: 2020-09-11 | Discharge: 2020-09-11 | Disposition: A | Discharge status: Home / Self Care | Payer: Medicaid Other | Source: Ambulatory Visit | Attending: Family Medicine | Admitting: Family Medicine

## 2020-09-11 VITALS — BP 128/80 | HR 109 | Wt 186.6 lb

## 2020-09-11 DIAGNOSIS — O0993 Supervision of high risk pregnancy, unspecified, third trimester: Secondary | ICD-10-CM | POA: Diagnosis not present

## 2020-09-11 DIAGNOSIS — O24410 Gestational diabetes mellitus in pregnancy, diet controlled: Secondary | ICD-10-CM

## 2020-09-11 DIAGNOSIS — Z3A36 36 weeks gestation of pregnancy: Secondary | ICD-10-CM

## 2020-09-11 DIAGNOSIS — Z98891 History of uterine scar from previous surgery: Secondary | ICD-10-CM

## 2020-09-11 NOTE — Progress Notes (Signed)
   PRENATAL VISIT NOTE  Subjective:  Claudia Lambert is a 35 y.o. [id number] at [redacted]w[redacted]d being seen today for ongoing prenatal care.  She is currently monitored for the following issues for this low-risk pregnancy and has Marijuana use; Supervision of high risk pregnancy, antepartum; History of diet controlled gestational diabetes mellitus (GDM); Asthma; History of cesarean delivery; Poor fetal growth affecting management of mother in second trimester; Abnormal echocardiogram; Large placenta lake; Pelvic pain affecting pregnancy in third trimester, antepartum; and GDM (gestational diabetes mellitus) on their problem list.  Patient reports no complaints.  Contractions: Irritability. Vag. Bleeding: None.  Movement: Present. Denies leaking of fluid.   CBG log reviewed and at goal. Undecided on birth control. Does not want any more children but also does not want to get a BTL. Has had difficulty with contraceptives in past.   The following portions of the patient's history were reviewed and updated as appropriate: allergies, current medications, past family history, past medical history, past social history, past surgical history and problem list.   Objective:   Vitals:   09/11/20 1547  BP: 128/80  Pulse: (!) 109  Weight: 186 lb 9.6 oz (84.6 kg)    Fetal Status: Fetal Heart Rate (bpm): 156   Movement: Present     General:  Alert, oriented and cooperative. Patient is in no acute distress.  Skin: Skin is warm and dry. No rash noted.   Cardiovascular: Normal heart rate noted  Respiratory: Normal respiratory effort, no problems with respiration noted  Abdomen: Soft, gravid, appropriate for gestational age.  Pain/Pressure: Present     Pelvic: Cervical exam deferred        Extremities: Normal range of motion.  Edema: None  Mental Status: Normal mood and affect. Normal behavior. Normal judgment and thought content.   Assessment and Plan:  Pregnancy: G4P1021 at [redacted]w[redacted]d 1. Supervision of high risk  pregnancy in third trimester -discussed contraception at length with patient. Has had generally negative experiences with most options. Discussed vasectomy as an option, she will discuss with her partner.   2. Diet controlled gestational diabetes mellitus (GDM) in third trimester At goal, continue current care plan  3. History of cesarean delivery Hx of placenta previa in prior pregnancy, reaffirmed desire for repeat c section today  4. [redacted] weeks gestation of pregnancy Swabs obtained  Preterm labor symptoms and general obstetric precautions including but not limited to vaginal bleeding, contractions, leaking of fluid and fetal movement were reviewed in detail with the patient. Please refer to After Visit Summary for other counseling recommendations.   No follow-ups on file.  Future Appointments  Date Time Provider Department Center  09/12/2020 10:15 AM [facility] NURSE [facility]  09/12/2020 10:30 AM [facility] US3 [facility]  09/19/2020  7:15 AM [facility] NURSE [facility]  09/19/2020  7:30 AM [facility] US2 [facility]  09/23/2020  4:00 PM Leo Rod [facility]  09/26/2020  7:30 AM [facility] US3 [facility]  09/30/2020  4:00 PM Theressa Millard, PT [facility]    Gita Kudo, MD

## 2020-09-12 ENCOUNTER — Telehealth: Payer: Self-pay | Admitting: *Deleted

## 2020-09-12 ENCOUNTER — Ambulatory Visit: Payer: Medicaid Other | Admitting: *Deleted

## 2020-09-12 ENCOUNTER — Encounter: Payer: Self-pay | Admitting: *Deleted

## 2020-09-12 ENCOUNTER — Ambulatory Visit: Payer: Medicaid Other | Attending: Obstetrics

## 2020-09-12 VITALS — BP 133/71 | HR 106

## 2020-09-12 DIAGNOSIS — O0990 Supervision of high risk pregnancy, unspecified, unspecified trimester: Secondary | ICD-10-CM

## 2020-09-12 DIAGNOSIS — O365930 Maternal care for other known or suspected poor fetal growth, third trimester, not applicable or unspecified: Secondary | ICD-10-CM | POA: Insufficient documentation

## 2020-09-12 LAB — GC/CHLAMYDIA PROBE AMP (~~LOC~~) NOT AT ARMC
Chlamydia: NEGATIVE
Comment: NEGATIVE
Comment: NORMAL
Neisseria Gonorrhea: NEGATIVE

## 2020-09-12 NOTE — Telephone Encounter (Signed)
Call to patient to advise of date for repeat cesarean section. No answer and no voice mail set up. Unable to leave message.  Letter mailed and available through My Chart.  Cesarean Section is scheduled for 10-02-20 at 9:30 am- arrive 0730 am.

## 2020-09-14 LAB — CULTURE, BETA STREP (GROUP B ONLY): Strep Gp B Culture: NEGATIVE

## 2020-09-16 ENCOUNTER — Other Ambulatory Visit (HOSPITAL_COMMUNITY)
Admission: RE | Admit: 2020-09-16 | Discharge: 2020-09-16 | Disposition: A | Discharge status: Home / Self Care | Payer: Medicaid Other | Source: Ambulatory Visit

## 2020-09-16 ENCOUNTER — Encounter: Payer: Medicaid Other | Admitting: Physical Therapy

## 2020-09-16 ENCOUNTER — Other Ambulatory Visit: Payer: Self-pay

## 2020-09-16 ENCOUNTER — Ambulatory Visit (INDEPENDENT_AMBULATORY_CARE_PROVIDER_SITE_OTHER): Payer: Medicaid Other

## 2020-09-16 VITALS — BP 123/79 | HR 107 | Wt 188.0 lb

## 2020-09-16 DIAGNOSIS — B373 Candidiasis of vulva and vagina: Secondary | ICD-10-CM | POA: Diagnosis not present

## 2020-09-16 DIAGNOSIS — Z3A37 37 weeks gestation of pregnancy: Secondary | ICD-10-CM

## 2020-09-16 DIAGNOSIS — O24410 Gestational diabetes mellitus in pregnancy, diet controlled: Secondary | ICD-10-CM

## 2020-09-16 DIAGNOSIS — O0990 Supervision of high risk pregnancy, unspecified, unspecified trimester: Secondary | ICD-10-CM

## 2020-09-16 DIAGNOSIS — B3731 Acute candidiasis of vulva and vagina: Secondary | ICD-10-CM

## 2020-09-16 DIAGNOSIS — Z98891 History of uterine scar from previous surgery: Secondary | ICD-10-CM

## 2020-09-16 MED ORDER — TERCONAZOLE 0.4 % VA CREA: 1.0000 | TOPICAL_CREAM | Freq: Every day | VAGINAL | 0 refills | Status: DC

## 2020-09-16 NOTE — Progress Notes (Signed)
PRENATAL VISIT NOTE  Subjective:  Claudia Lambert is a 35 y.o. [id number] at [redacted]w[redacted]d being seen today for ongoing prenatal care.  She is currently monitored for the following issues for this high-risk pregnancy and has Marijuana use; Supervision of high risk pregnancy, antepartum; History of diet controlled gestational diabetes mellitus (GDM); Asthma; History of cesarean delivery; Poor fetal growth affecting management of mother in second trimester; Abnormal echocardiogram; Large placenta lake; Pelvic pain affecting pregnancy in third trimester, antepartum; and GDM (gestational diabetes mellitus) on their problem list.  Patient reports this morning she "heard a pop" which she reports woke her boyfriend up out of his sleep. She immediately checked to see if there was any fluid at that time, which there was not. She says prior to coming to her appointment today she did notice a small amount of clear discharge on her underwear. She has not had to wear a pad. She denies vaginal bleeding, itching, irritation, or odor.  Reports mild lower abdominal cramping. Contractions: Irritability. Vag. Bleeding: None.  Movement: Present. Denies leaking of fluid.   The following portions of the patient's history were reviewed and updated as appropriate: allergies, current medications, past family history, past medical history, past social history, past surgical history and problem list.   Objective:   Vitals:   09/16/20 1406  BP: 123/79  Pulse: (!) 107  Weight: 188 lb (85.3 kg)    Fetal Status: Fetal Heart Rate (bpm): 145 Fundal Height: 38 cm Movement: Present  Presentation: Vertex  General:  Alert, oriented and cooperative. Patient is in no acute distress.  Skin: Skin is warm and dry. No rash noted.   Cardiovascular: Normal heart rate noted  Respiratory: Normal respiratory effort, no problems with respiration noted  Abdomen: Soft, gravid, appropriate for gestational age.  Pain/Pressure: Present     Pelvic:  Cervical exam performed in the presence of a chaperone Dilation: Closed Effacement (%): Thick Station: -2, speculum: vaginal mucosa pink, cervix without lesions/masses, no pooling of amniotic fluid, no blood, small amount of thick, white discharge with moderate amount of thick, white patches c/w yeast present along vaginal sidewalls  Extremities: Normal range of motion.  Edema: None  Mental Status: Normal mood and affect. Normal behavior. Normal judgment and thought content.   Assessment and Plan:  Pregnancy: G4P1021 at [redacted]w[redacted]d  1. Supervision of high risk pregnancy, antepartum - Routine OB care - Sterile speculum exam negative for pooling of amniotic fluid, however positive for yeast - Patient asymptomatic but requesting treatment. Terazol 7 cream sent to pharmacy - Anticipatory guidance for upcoming appointment provided  2. Diet controlled gestational diabetes mellitus (GDM) in third trimester - Patient did not bring BS log today. Encouraged to bring to next appointment. - Patient reports fasting CBGs < 95 and 2hr PP < 125  3. History of cesarean delivery - Desires repeat, scheduled on 6/30  4. [redacted] weeks gestation of pregnancy    Term labor symptoms and general obstetric precautions including but not limited to vaginal bleeding, contractions, leaking of fluid and fetal movement were reviewed in detail with the patient. Please refer to After Visit Summary for other counseling recommendations.   Return in about 1 week (around 09/23/2020).  Future Appointments  Date Time Provider Department Center  09/23/2020  4:00 PM Leo Rod [facility]  09/26/2020  7:30 AM [facility] US3 [facility]  09/26/2020  9:40 AM [place] Bing, MD [facility]  09/30/2020  4:00 PM Theressa Millard, PT [facility]  Brand Males, CNM 09/16/20 2:43 PM

## 2020-09-17 LAB — CERVICOVAGINAL ANCILLARY ONLY
Bacterial Vaginitis (gardnerella): NEGATIVE
Candida Glabrata: NEGATIVE
Candida Vaginitis: NEGATIVE
Chlamydia: NEGATIVE
Comment: NEGATIVE
Comment: NEGATIVE
Comment: NEGATIVE
Comment: NEGATIVE
Comment: NEGATIVE
Comment: NORMAL
Neisseria Gonorrhea: NEGATIVE
Trichomonas: NEGATIVE

## 2020-09-19 ENCOUNTER — Other Ambulatory Visit: Payer: Medicaid Other

## 2020-09-19 ENCOUNTER — Ambulatory Visit: Payer: Medicaid Other

## 2020-09-22 NOTE — Patient Instructions (Signed)
Ob  Kyeshia Membreno  09/22/2020   Your procedure is scheduled on:  6.30.2022  Arrive at 0730 at Entrance C on [address] at [facility]  and [employer]. You are invited to use the FREE valet parking or use the Visitor's parking deck.  Pick up the phone at the desk and dial [phone].  Call this number if you have problems the morning of surgery: [phone]  Remember:   Do not eat food:(After Midnight) Desps de medianoche.  Do not drink clear liquids: (After Midnight) Desps de medianoche.  Take these medicines the morning of surgery with A SIP OF WATER:  none   Do not wear jewelry, make-up or nail polish.  Do not wear lotions, powders, or perfumes. Do not wear deodorant.  Do not shave 48 hours prior to surgery.  Do not bring valuables to the hospital.  [facility] is not   responsible for any belongings or valuables brought to the hospital.  Contacts, dentures or bridgework may not be worn into surgery.  Leave suitcase in the car. After surgery it may be brought to your room.  For patients admitted to the hospital, checkout time is 11:00 AM the day of              discharge.      Please read over the following fact sheets that you were given:     Preparing for Surgery

## 2020-09-23 ENCOUNTER — Encounter: Payer: Self-pay | Admitting: Physical Therapy

## 2020-09-23 ENCOUNTER — Other Ambulatory Visit: Payer: Self-pay

## 2020-09-23 ENCOUNTER — Encounter (HOSPITAL_COMMUNITY): Payer: Self-pay

## 2020-09-23 ENCOUNTER — Encounter: Payer: Medicaid Other | Attending: Obstetrics and Gynecology | Admitting: Physical Therapy

## 2020-09-23 DIAGNOSIS — S334XXA Traumatic rupture of symphysis pubis, initial encounter: Secondary | ICD-10-CM | POA: Insufficient documentation

## 2020-09-23 DIAGNOSIS — M6281 Muscle weakness (generalized): Secondary | ICD-10-CM

## 2020-09-23 DIAGNOSIS — M545 Low back pain, unspecified: Secondary | ICD-10-CM

## 2020-09-23 DIAGNOSIS — X58XXXA Exposure to other specified factors, initial encounter: Secondary | ICD-10-CM | POA: Diagnosis not present

## 2020-09-23 DIAGNOSIS — R252 Cramp and spasm: Secondary | ICD-10-CM

## 2020-09-23 DIAGNOSIS — M899 Disorder of bone, unspecified: Secondary | ICD-10-CM

## 2020-09-23 NOTE — Therapy (Signed)
[place] at [facility] [address] [place], [place], [address] Phone: [phone]   Fax:  [phone]  Physical Therapy Treatment  Patient Details  Name: Claudia Lambert MRN: [id number] Date of Birth: [birth year] Referring Provider (PT): Marcille Buffy CNM   Encounter Date: 09/23/2020   PT End of Session - 09/23/20 1648     Visit Number 6    Date for PT Re-Evaluation 09/24/20    Authorization Type [employer]    Authorization Time Period 4/18-6/22    Authorization - Visit Number 5    Authorization - Number of Visits 12    PT Start Time 1600    PT Stop Time 1640    PT Time Calculation (min) 40 min    Activity Tolerance Patient tolerated treatment well    Behavior During Therapy [place] for tasks assessed/performed             Past Medical History:  Diagnosis Date   Asthma    Eczema    GDM (gestational diabetes mellitus) 07/16/2020   Infection    UTI    Past Surgical History:  Procedure Laterality Date   CESAREAN SECTION     Placenta Previa    There were no vitals filed for this visit.   Subjective Assessment - 09/23/20 1606     Subjective Patient is doing okay. Patient has a pinched pain in the upper back. Her pelvic pain is not as bad. She has been doing her exercises. I am being induced on 10/02/2020. I am having swelling in my feet. Patient is not having poping sound in the pelvis for 2 weeks. Hard to move from the right ot the left.    Patient Stated Goals manage pain    Currently in Pain? Yes    Pain Score 7     Pain Location Pelvis    Pain Orientation Mid    Pain Descriptors / Indicators Aching    Pain Type Acute pain    Pain Onset More than a month ago    Pain Frequency Intermittent    Aggravating Factors  gettin up from laying down position, getting out of the car    Pain Relieving Factors pulling on leg, sitting on ball to stretch    Multiple Pain Sites Yes    Pain Score 10    Pain Location Back     Pain Orientation Right;Mid    Pain Descriptors / Indicators Sore;Other (Comment)   pinch   Pain Type Acute pain    Pain Onset More than a month ago    Pain Frequency Intermittent    Aggravating Factors  not sure    Pain Relieving Factors not sure                [facility] PT Assessment - 09/23/20 0001       Assessment   Medical Diagnosis S33.4XXA Dislocation of symphysis pubis, initial encounter    Referring Provider (PT) Marcille Buffy CNM    Onset Date/Surgical Date --   04/05/2020   Prior Therapy none      Precautions   Precautions Other (comment)    Precaution Comments pregnant      Restrictions   Weight Bearing Restrictions No      [place] residence    Living Arrangements Spouse/significant other      Prior Function   Level of Independence Independent    Vocation Full time employment    Vocation Requirements  sitting at the computer      Cognition   Overall Cognitive Status Within Functional Limits for tasks assessed      Posture/Postural Control   Posture/Postural Control No significant limitations    Posture Comments pregnant posture      Strength   Right Hip Flexion 5/5    Right Hip Extension 3-/5    Right Hip ABduction 3/5    Right Hip ADduction 3/5    Left Hip Flexion 3/5   painful   Left Hip Extension 3-/5    Left Hip ABduction 3/5    Left Hip ADduction 3/5      Palpation   SI assessment  ASIS are equal                        Pelvic Floor Special Questions - 09/23/20 0001     Number of Pregnancies 2   due on 10/07/2020   Number of C-Sections 1    Urinary Leakage No               [facility] Adult PT Treatment/Exercise - 09/23/20 0001       Self-Care   Self-Care Other Self-Care Comments    Other Self-Care Comments  how to make an ice pack placing ice  cubes into a bag and place on pubic bone; verbally went over c-section scar care;      Manual Therapy   Manual Therapy Soft tissue  mobilization;Joint mobilization;Muscle Energy Technique    Manual therapy comments instructed patient to massage her hip adductors and using a tennis ball for the right mid back    Joint Mobilization gapping of right side of T3-T5 and right mid ribs posteriorly    Soft tissue mobilization manual work to bilateral hip adductors, right thoracic area, right interscapular and along the scapula border, right mid intercoastals post.    Muscle Energy Technique muscle energy to imporv movement of T4-T7                      PT Short Term Goals - 07/29/20 1507       PT SHORT TERM GOAL #1   Title independent with initial HEP    Time 4    Period Weeks    Status Achieved               PT Long Term Goals - 09/23/20 1649       PT LONG TERM GOAL #1   Title indepedent with advanced HEP to manage her pain while being pregnant    Time 12    Period Weeks    Status Achieved      PT LONG TERM GOAL #2   Title able to use the belly band for keeping her SI joint in correct alignment    Time 12    Period Weeks    Status Achieved      PT LONG TERM GOAL #3   Title understand how to manuever in bed and with correct body mechanics to reduce strain on the symphysis pubic bone and back    Time 12    Period Weeks    Status Achieved      PT LONG TERM GOAL #4   Title able to manage her pain around 4/10 instead of 7/10 due to increased bilateral hip strength to >/= 4/10    Time 12    Period Weeks    Status Partially Met  PT LONG TERM GOAL #5   Title understand ways to give birth to not strain her back and symphysis pubis    Baseline understands for c-section    Time 12    Period Weeks    Status Achieved                   Plan - 09/23/20 1648     Clinical Impression Statement Patient understands ways to manage her pain with movement, keeping spinal neutral, massaging the inner thighs and suprapubically, and lifting her abdomen when turning in bed. Patient pelvis in  correct alignment. She understands how to take care of c-section scar. Patient will be having a c-section on 10/02/2020. Patient has improved mobility of thoracic and lumbar spine. Patient is doing her exercises and ready for discharge.    Personal Factors and Comorbidities Comorbidity 2;Fitness    Comorbidities pregnant and has had a prior c-section; history of placent pervia    Examination-Activity Limitations Locomotion Level;Transfers;Caring for Others;Carry;Dressing;Lift;Stand;Sit;Sleep    Examination-Participation Restrictions Meal Prep;Cleaning;Community Activity;Driving;Laundry    Stability/Clinical Decision Making Evolving/Moderate complexity    Rehab Potential Excellent    PT Treatment/Interventions ADLs/Self Care Home Management;Cryotherapy;Moist Heat;Therapeutic activities;Therapeutic exercise;Neuromuscular re-education;Manual techniques;Patient/family education;Joint Manipulations;Spinal Manipulations;Taping    PT Next Visit Plan Discharge to HEP; After patient has her baby she would benefit from further physical therapy to continue on pain reduction and improve strength    PT Home Exercise Plan Access Code: [email or web address]    Consulted and Agree with Plan of Care Patient             Patient will benefit from skilled therapeutic intervention in order to improve the following deficits and impairments:  Decreased range of motion, Increased fascial restricitons, Pain, Decreased strength, Decreased mobility, Decreased activity tolerance, Decreased endurance, Increased muscle spasms  Visit Diagnosis: Muscle weakness (generalized)  Cramp and spasm  Acute bilateral low back pain without sciatica  Pubic bone pain     Problem List Patient Active Problem List   Diagnosis Date Noted   GDM (gestational diabetes mellitus) 07/16/2020   Large placenta lake 07/14/2020   Pelvic pain affecting pregnancy in third trimester, antepartum 07/14/2020   Abnormal echocardiogram 06/12/2020   History  of cesarean delivery 06/11/2020   Poor fetal growth affecting management of mother in second trimester 06/11/2020   Supervision of high risk pregnancy, antepartum 04/10/2020   History of diet controlled gestational diabetes mellitus (GDM) 04/10/2020   Asthma 04/10/2020   Marijuana use 03/04/2020    Earlie Counts, PT 09/23/20 4:56 PM  [place] at [facility] [address], [place], [place], [address] Phone: [phone]   Fax:  [phone]  Name: Claudia Lambert MRN: [id number] Date of Birth: 1985-06-21   PHYSICAL THERAPY DISCHARGE SUMMARY  Visits from Start of Care: 6  Current functional level related to goals / functional outcomes: See above.    Remaining deficits: See above.    Education / Equipment: HEP   Patient agrees to discharge. Patient goals were met. Patient is being discharged due to meeting the stated rehab goals. Thank you for the referral. Earlie Counts, PT 09/23/20 4:57 PM

## 2020-09-26 ENCOUNTER — Other Ambulatory Visit: Payer: Self-pay

## 2020-09-26 ENCOUNTER — Encounter: Payer: Self-pay | Admitting: *Deleted

## 2020-09-26 ENCOUNTER — Encounter: Payer: Self-pay | Admitting: Obstetrics and Gynecology

## 2020-09-26 ENCOUNTER — Ambulatory Visit: Payer: Medicaid Other | Attending: Obstetrics

## 2020-09-26 ENCOUNTER — Ambulatory Visit (INDEPENDENT_AMBULATORY_CARE_PROVIDER_SITE_OTHER): Payer: Medicaid Other | Admitting: Obstetrics and Gynecology

## 2020-09-26 ENCOUNTER — Ambulatory Visit: Payer: Medicaid Other | Admitting: *Deleted

## 2020-09-26 VITALS — BP 132/87 | HR 114 | Wt 191.4 lb

## 2020-09-26 VITALS — BP 122/77 | HR 101

## 2020-09-26 DIAGNOSIS — Z362 Encounter for other antenatal screening follow-up: Secondary | ICD-10-CM | POA: Diagnosis not present

## 2020-09-26 DIAGNOSIS — R102 Pelvic and perineal pain: Secondary | ICD-10-CM

## 2020-09-26 DIAGNOSIS — O09523 Supervision of elderly multigravida, third trimester: Secondary | ICD-10-CM

## 2020-09-26 DIAGNOSIS — Z98891 History of uterine scar from previous surgery: Secondary | ICD-10-CM

## 2020-09-26 DIAGNOSIS — Z3A38 38 weeks gestation of pregnancy: Secondary | ICD-10-CM

## 2020-09-26 DIAGNOSIS — O365930 Maternal care for other known or suspected poor fetal growth, third trimester, not applicable or unspecified: Secondary | ICD-10-CM

## 2020-09-26 DIAGNOSIS — O43109 Malformation of placenta, unspecified, unspecified trimester: Secondary | ICD-10-CM

## 2020-09-26 DIAGNOSIS — R931 Abnormal findings on diagnostic imaging of heart and coronary circulation: Secondary | ICD-10-CM

## 2020-09-26 DIAGNOSIS — O26893 Other specified pregnancy related conditions, third trimester: Secondary | ICD-10-CM

## 2020-09-26 DIAGNOSIS — O34219 Maternal care for unspecified type scar from previous cesarean delivery: Secondary | ICD-10-CM | POA: Diagnosis not present

## 2020-09-26 DIAGNOSIS — F1290 Cannabis use, unspecified, uncomplicated: Secondary | ICD-10-CM

## 2020-09-26 DIAGNOSIS — O0990 Supervision of high risk pregnancy, unspecified, unspecified trimester: Secondary | ICD-10-CM | POA: Diagnosis present

## 2020-09-26 DIAGNOSIS — O365920 Maternal care for other known or suspected poor fetal growth, second trimester, not applicable or unspecified: Secondary | ICD-10-CM

## 2020-09-26 DIAGNOSIS — O0973 Supervision of high risk pregnancy due to social problems, third trimester: Secondary | ICD-10-CM

## 2020-09-26 DIAGNOSIS — Z8632 Personal history of gestational diabetes: Secondary | ICD-10-CM

## 2020-09-26 DIAGNOSIS — O24410 Gestational diabetes mellitus in pregnancy, diet controlled: Secondary | ICD-10-CM

## 2020-09-26 NOTE — Progress Notes (Signed)
   PRENATAL VISIT NOTE  Subjective:  Claudia Lambert is a 35 y.o. D1S9702 at [redacted]w[redacted]d being seen today for ongoing prenatal Lambert.  She is currently monitored for the following issues for this high-risk pregnancy and has Marijuana use; Supervision of high risk pregnancy, antepartum; Asthma; History of cesarean delivery; Poor fetal growth affecting management of mother in second trimester; Abnormal echocardiogram; Large placenta lake; Pelvic pain affecting pregnancy in third trimester, antepartum; GDM (gestational diabetes mellitus); AMA (advanced maternal age) multigravida 35+, third trimester; and Supervision of high risk pregnancy due to social problems in third trimester on their problem list.  Patient reports no complaints.  Contractions: Irritability. Vag. Bleeding: None.  Movement: Present. Denies leaking of fluid.   The following portions of the patient's history were reviewed and updated as appropriate: allergies, current medications, past family history, past medical history, past social history, past surgical history and problem list.   Objective:   Vitals:   09/26/20 1018  BP: 132/87  Pulse: (!) 114  Weight: 191 lb 6.4 oz (86.8 kg)    Fetal Status: Fetal Heart Rate (bpm): 141   Movement: Present     General:  Alert, oriented and cooperative. Patient is in no acute distress.  Skin: Skin is warm and dry. No rash noted.   Cardiovascular: Normal heart rate noted  Respiratory: Normal respiratory effort, no problems with respiration noted  Abdomen: Soft, gravid, appropriate for gestational age.  Pain/Pressure: Present     Pelvic: Cervical exam deferred        Extremities: Normal range of motion.  Edema: Trace  Mental Status: Normal mood and affect. Normal behavior. Normal judgment and thought content.   Assessment and Plan:  Pregnancy: G4P1021 at [redacted]w[redacted]d 1. [redacted] weeks gestation of pregnancy Pt needs help with car seat and crib. Will have navigator talk to patient. Recommend SW consult  PP  2. Abnormal echocardiogram Nothing to do. S/p cards consult in April. See problem list  3. AMA (advanced maternal age) multigravida 35+, third trimester No issues  4. History of cesarean delivery For rpt in 6 days  5. Large placenta lake Seen in earlier u/s. No comment on most recent. Pt has posterior placenta  6. Marijuana use UDS on admission  7. Pelvic pain affecting pregnancy in third trimester, antepartum Followed by PT  8. Poor fetal growth affecting management of mother in second trimester, single or unspecified fetus 6/24: ceph, afi 7, 8/8, 4cm pocket, 1837gm, 13%, ac 18%, ceph  9. Supervision of high risk pregnancy, antepartum  10. Diet controlled gestational diabetes mellitus (GDM) in third trimester Forgot log book today but gives normal am fasting and 2 hour pp values.   11. Supervision of high risk pregnancy due to social problems in third trimester  Preterm labor symptoms and general obstetric precautions including but not limited to vaginal bleeding, contractions, leaking of fluid and fetal movement were reviewed in detail with the patient. Please refer to After Visit Summary for other counseling recommendations.   Return in about 17 days (around 10/13/2020) for in person. Incision check   Future Appointments  Date Time Provider Department Center  09/30/2020  9:30 AM [facility] PAT 1 [facility] None  09/30/2020 10:30 AM [facility]-SCREENING [facility] None    [place] Bing, MD

## 2020-09-29 ENCOUNTER — Other Ambulatory Visit: Payer: Self-pay | Admitting: Obstetrics & Gynecology

## 2020-09-30 ENCOUNTER — Encounter (HOSPITAL_COMMUNITY)
Admission: RE | Admit: 2020-09-30 | Discharge: 2020-09-30 | Disposition: A | Discharge status: Home / Self Care | Payer: Medicaid Other | Source: Ambulatory Visit | Attending: Obstetrics & Gynecology | Admitting: Obstetrics & Gynecology

## 2020-09-30 ENCOUNTER — Other Ambulatory Visit: Payer: Self-pay

## 2020-09-30 ENCOUNTER — Other Ambulatory Visit (HOSPITAL_COMMUNITY)
Admission: RE | Admit: 2020-09-30 | Discharge: 2020-09-30 | Disposition: A | Discharge status: Home / Self Care | Payer: Medicaid Other | Source: Ambulatory Visit | Attending: Obstetrics & Gynecology | Admitting: Obstetrics & Gynecology

## 2020-09-30 ENCOUNTER — Encounter: Payer: Medicaid Other | Admitting: Physical Therapy

## 2020-09-30 DIAGNOSIS — Z20822 Contact with and (suspected) exposure to covid-19: Secondary | ICD-10-CM | POA: Diagnosis not present

## 2020-09-30 DIAGNOSIS — Z01812 Encounter for preprocedural laboratory examination: Secondary | ICD-10-CM | POA: Insufficient documentation

## 2020-09-30 LAB — CBC
HCT: 37.6 % (ref 36.0–46.0)
Hemoglobin: 12.9 g/dL (ref 12.0–15.0)
MCH: 32.7 pg (ref 26.0–34.0)
MCHC: 34.3 g/dL (ref 30.0–36.0)
MCV: 95.4 fL (ref 80.0–100.0)
Platelets: 267 10*3/uL (ref 150–400)
RBC: 3.94 MIL/uL (ref 3.87–5.11)
RDW: 13 % (ref 11.5–15.5)
WBC: 6.9 10*3/uL (ref 4.0–10.5)
nRBC: 0 % (ref 0.0–0.2)

## 2020-09-30 LAB — SARS CORONAVIRUS 2 (TAT 6-24 HRS): SARS Coronavirus 2: NEGATIVE

## 2020-09-30 LAB — RPR: RPR Ser Ql: NONREACTIVE

## 2020-09-30 LAB — TYPE AND SCREEN
ABO/RH(D): O POS
Antibody Screen: NEGATIVE

## 2020-10-02 ENCOUNTER — Inpatient Hospital Stay (HOSPITAL_COMMUNITY)
Admission: RE | Admit: 2020-10-02 | Discharge: 2020-10-05 | DRG: 787 | Disposition: A | Discharge status: Home / Self Care | Payer: Medicaid Other | Attending: Obstetrics & Gynecology | Admitting: Obstetrics & Gynecology

## 2020-10-02 ENCOUNTER — Encounter (HOSPITAL_COMMUNITY)
Admission: RE | Disposition: A | Discharge status: Home / Self Care | Payer: Self-pay | Source: Home / Self Care | Attending: Obstetrics & Gynecology

## 2020-10-02 ENCOUNTER — Inpatient Hospital Stay (HOSPITAL_COMMUNITY): Payer: Medicaid Other | Admitting: Certified Registered"

## 2020-10-02 ENCOUNTER — Encounter (HOSPITAL_COMMUNITY): Payer: Self-pay | Admitting: Obstetrics & Gynecology

## 2020-10-02 ENCOUNTER — Other Ambulatory Visit: Payer: Self-pay

## 2020-10-02 DIAGNOSIS — F1290 Cannabis use, unspecified, uncomplicated: Secondary | ICD-10-CM | POA: Diagnosis present

## 2020-10-02 DIAGNOSIS — O34211 Maternal care for low transverse scar from previous cesarean delivery: Principal | ICD-10-CM | POA: Diagnosis present

## 2020-10-02 DIAGNOSIS — R200 Anesthesia of skin: Secondary | ICD-10-CM | POA: Diagnosis not present

## 2020-10-02 DIAGNOSIS — O24420 Gestational diabetes mellitus in childbirth, diet controlled: Secondary | ICD-10-CM | POA: Diagnosis present

## 2020-10-02 DIAGNOSIS — O99893 Other specified diseases and conditions complicating puerperium: Secondary | ICD-10-CM | POA: Diagnosis not present

## 2020-10-02 DIAGNOSIS — O99324 Drug use complicating childbirth: Secondary | ICD-10-CM | POA: Diagnosis present

## 2020-10-02 DIAGNOSIS — O24419 Gestational diabetes mellitus in pregnancy, unspecified control: Secondary | ICD-10-CM | POA: Diagnosis present

## 2020-10-02 DIAGNOSIS — Z3A39 39 weeks gestation of pregnancy: Secondary | ICD-10-CM | POA: Diagnosis not present

## 2020-10-02 DIAGNOSIS — Z98891 History of uterine scar from previous surgery: Secondary | ICD-10-CM

## 2020-10-02 LAB — GLUCOSE, CAPILLARY
Glucose-Capillary: 99 mg/dL (ref 70–99)
Glucose-Capillary: 93 mg/dL (ref 70–99)

## 2020-10-02 SURGERY — Surgical Case
Anesthesia: Spinal

## 2020-10-02 MED ORDER — BUPIVACAINE IN DEXTROSE 0.75-8.25 % IT SOLN
INTRATHECAL | Status: DC | PRN
  Administered 2020-10-02: 1.8 mL via INTRATHECAL

## 2020-10-02 MED ORDER — GABAPENTIN 300 MG PO CAPS
ORAL_CAPSULE | ORAL | Status: AC
  Filled 2020-10-02: qty 1

## 2020-10-02 MED ORDER — NALBUPHINE HCL 10 MG/ML IJ SOLN: 5.0000 mg | Freq: Once | INTRAMUSCULAR | Status: DC | PRN

## 2020-10-02 MED ORDER — PHENYLEPHRINE 40 MCG/ML (10ML) SYRINGE FOR IV PUSH (FOR BLOOD PRESSURE SUPPORT)
PREFILLED_SYRINGE | INTRAVENOUS | Status: AC
  Filled 2020-10-02: qty 10

## 2020-10-02 MED ORDER — POVIDONE-IODINE 10 % EX SWAB
2.0000 "application " | Freq: Once | CUTANEOUS | Status: AC
  Administered 2020-10-02: 2 via TOPICAL

## 2020-10-02 MED ORDER — KETOROLAC TROMETHAMINE 30 MG/ML IJ SOLN: 30.0000 mg | Freq: Four times a day (QID) | INTRAMUSCULAR | Status: DC | PRN

## 2020-10-02 MED ORDER — DIBUCAINE (PERIANAL) 1 % EX OINT: 1.0000 "application " | TOPICAL_OINTMENT | CUTANEOUS | Status: DC | PRN

## 2020-10-02 MED ORDER — DEXAMETHASONE SODIUM PHOSPHATE 4 MG/ML IJ SOLN
INTRAMUSCULAR | Status: AC
  Filled 2020-10-02: qty 2

## 2020-10-02 MED ORDER — OXYCODONE HCL 5 MG/5ML PO SOLN: 5.0000 mg | Freq: Once | ORAL | Status: DC | PRN

## 2020-10-02 MED ORDER — NALBUPHINE HCL 10 MG/ML IJ SOLN: 5.0000 mg | INTRAMUSCULAR | Status: DC | PRN

## 2020-10-02 MED ORDER — METOCLOPRAMIDE HCL 5 MG/ML IJ SOLN
INTRAMUSCULAR | Status: AC
  Filled 2020-10-02: qty 2

## 2020-10-02 MED ORDER — MORPHINE SULFATE (PF) 0.5 MG/ML IJ SOLN
INTRAMUSCULAR | Status: DC | PRN
  Administered 2020-10-02: .15 mg via INTRATHECAL

## 2020-10-02 MED ORDER — OXYTOCIN-SODIUM CHLORIDE 30-0.9 UT/500ML-% IV SOLN: 2.5000 [IU]/h | INTRAVENOUS | Status: AC

## 2020-10-02 MED ORDER — MORPHINE SULFATE (PF) 0.5 MG/ML IJ SOLN
INTRAMUSCULAR | Status: AC
  Filled 2020-10-02: qty 10

## 2020-10-02 MED ORDER — ONDANSETRON HCL 4 MG/2ML IJ SOLN
INTRAMUSCULAR | Status: DC | PRN
  Administered 2020-10-02: 4 mg via INTRAVENOUS

## 2020-10-02 MED ORDER — FENTANYL CITRATE (PF) 100 MCG/2ML IJ SOLN
INTRAMUSCULAR | Status: AC
  Filled 2020-10-02: qty 2

## 2020-10-02 MED ORDER — LACTATED RINGERS IV SOLN: INTRAVENOUS | Status: DC

## 2020-10-02 MED ORDER — OXYCODONE HCL 5 MG PO TABS: 5.0000 mg | ORAL_TABLET | Freq: Once | ORAL | Status: DC | PRN

## 2020-10-02 MED ORDER — SIMETHICONE 80 MG PO CHEW: 80.0000 mg | CHEWABLE_TABLET | ORAL | Status: DC | PRN

## 2020-10-02 MED ORDER — DEXAMETHASONE SODIUM PHOSPHATE 4 MG/ML IJ SOLN
INTRAMUSCULAR | Status: DC | PRN
  Administered 2020-10-02: 8 mg via INTRAVENOUS

## 2020-10-02 MED ORDER — KETOROLAC TROMETHAMINE 30 MG/ML IJ SOLN
30.0000 mg | Freq: Four times a day (QID) | INTRAMUSCULAR | Status: DC | PRN
  Administered 2020-10-02: 30 mg via INTRAVENOUS

## 2020-10-02 MED ORDER — ZOLPIDEM TARTRATE 5 MG PO TABS: 5.0000 mg | ORAL_TABLET | Freq: Every evening | ORAL | Status: DC | PRN

## 2020-10-02 MED ORDER — MEPERIDINE HCL 25 MG/ML IJ SOLN: 6.2500 mg | INTRAMUSCULAR | Status: DC | PRN

## 2020-10-02 MED ORDER — GABAPENTIN 300 MG PO CAPS
300.0000 mg | ORAL_CAPSULE | ORAL | Status: AC
  Administered 2020-10-02: 300 mg via ORAL

## 2020-10-02 MED ORDER — MEDROXYPROGESTERONE ACETATE 150 MG/ML IM SUSP: 150.0000 mg | INTRAMUSCULAR | Status: DC | PRN

## 2020-10-02 MED ORDER — SCOPOLAMINE 1 MG/3DAYS TD PT72
1.0000 | MEDICATED_PATCH | Freq: Once | TRANSDERMAL | Status: AC
  Administered 2020-10-02: 1.5 mg via TRANSDERMAL

## 2020-10-02 MED ORDER — ENOXAPARIN SODIUM 40 MG/0.4ML IJ SOSY
40.0000 mg | PREFILLED_SYRINGE | INTRAMUSCULAR | Status: DC
  Administered 2020-10-03 – 2020-10-05 (×3): 40 mg via SUBCUTANEOUS
  Filled 2020-10-02 (×3): qty 0.4

## 2020-10-02 MED ORDER — WITCH HAZEL-GLYCERIN EX PADS
1.0000 | MEDICATED_PAD | CUTANEOUS | Status: DC | PRN
Start: 2020-10-02 — End: 2020-10-05

## 2020-10-02 MED ORDER — DIPHENHYDRAMINE HCL 50 MG/ML IJ SOLN: 12.5000 mg | INTRAMUSCULAR | Status: DC | PRN

## 2020-10-02 MED ORDER — SOD CITRATE-CITRIC ACID 500-334 MG/5ML PO SOLN
30.0000 mL | ORAL | Status: AC
  Administered 2020-10-02: 30 mL via ORAL

## 2020-10-02 MED ORDER — MEASLES, MUMPS & RUBELLA VAC IJ SOLR: 0.5000 mL | Freq: Once | INTRAMUSCULAR | Status: DC

## 2020-10-02 MED ORDER — METOCLOPRAMIDE HCL 5 MG/ML IJ SOLN
INTRAMUSCULAR | Status: DC | PRN
  Administered 2020-10-02: 10 mg via INTRAVENOUS

## 2020-10-02 MED ORDER — CELECOXIB 200 MG PO CAPS
ORAL_CAPSULE | ORAL | Status: AC
  Filled 2020-10-02: qty 2

## 2020-10-02 MED ORDER — TRANEXAMIC ACID-NACL 1000-0.7 MG/100ML-% IV SOLN
1000.0000 mg | INTRAVENOUS | Status: AC
  Administered 2020-10-02: 1000 mg via INTRAVENOUS

## 2020-10-02 MED ORDER — ONDANSETRON HCL 4 MG/2ML IJ SOLN
INTRAMUSCULAR | Status: AC
  Filled 2020-10-02: qty 2

## 2020-10-02 MED ORDER — IBUPROFEN 600 MG PO TABS
600.0000 mg | ORAL_TABLET | Freq: Four times a day (QID) | ORAL | Status: DC
  Administered 2020-10-03 – 2020-10-05 (×7): 600 mg via ORAL
  Filled 2020-10-02 (×7): qty 1

## 2020-10-02 MED ORDER — CEFAZOLIN SODIUM-DEXTROSE 2-3 GM-%(50ML) IV SOLR: INTRAVENOUS | Status: DC | PRN

## 2020-10-02 MED ORDER — ONDANSETRON HCL 4 MG/2ML IJ SOLN: 4.0000 mg | Freq: Three times a day (TID) | INTRAMUSCULAR | Status: DC | PRN

## 2020-10-02 MED ORDER — ACETAMINOPHEN 10 MG/ML IV SOLN
1000.0000 mg | Freq: Once | INTRAVENOUS | Status: DC | PRN
  Administered 2020-10-02: 1000 mg via INTRAVENOUS

## 2020-10-02 MED ORDER — DIPHENHYDRAMINE HCL 25 MG PO CAPS: 25.0000 mg | ORAL_CAPSULE | ORAL | Status: DC | PRN

## 2020-10-02 MED ORDER — SIMETHICONE 80 MG PO CHEW
80.0000 mg | CHEWABLE_TABLET | Freq: Three times a day (TID) | ORAL | Status: DC
  Administered 2020-10-02 – 2020-10-05 (×8): 80 mg via ORAL
  Filled 2020-10-02 (×8): qty 1

## 2020-10-02 MED ORDER — NALOXONE HCL 0.4 MG/ML IJ SOLN: 0.4000 mg | INTRAMUSCULAR | Status: DC | PRN

## 2020-10-02 MED ORDER — CEFAZOLIN SODIUM-DEXTROSE 2-4 GM/100ML-% IV SOLN
2.0000 g | INTRAVENOUS | Status: AC
  Administered 2020-10-02: 2 g via INTRAVENOUS

## 2020-10-02 MED ORDER — OXYTOCIN-SODIUM CHLORIDE 30-0.9 UT/500ML-% IV SOLN
INTRAVENOUS | Status: AC
  Filled 2020-10-02: qty 500

## 2020-10-02 MED ORDER — KETOROLAC TROMETHAMINE 30 MG/ML IJ SOLN: 30.0000 mg | Freq: Once | INTRAMUSCULAR | Status: DC

## 2020-10-02 MED ORDER — SCOPOLAMINE 1 MG/3DAYS TD PT72
MEDICATED_PATCH | TRANSDERMAL | Status: AC
  Filled 2020-10-02: qty 1

## 2020-10-02 MED ORDER — MENTHOL 3 MG MT LOZG: 1.0000 | LOZENGE | OROMUCOSAL | Status: DC | PRN

## 2020-10-02 MED ORDER — OXYTOCIN-SODIUM CHLORIDE 30-0.9 UT/500ML-% IV SOLN
INTRAVENOUS | Status: DC | PRN
  Administered 2020-10-02: 200 mL via INTRAVENOUS

## 2020-10-02 MED ORDER — TRANEXAMIC ACID-NACL 1000-0.7 MG/100ML-% IV SOLN
INTRAVENOUS | Status: AC
  Filled 2020-10-02: qty 100

## 2020-10-02 MED ORDER — HYDROMORPHONE HCL 1 MG/ML IJ SOLN: 0.2500 mg | INTRAMUSCULAR | Status: DC | PRN

## 2020-10-02 MED ORDER — PHENYLEPHRINE HCL-NACL 20-0.9 MG/250ML-% IV SOLN
INTRAVENOUS | Status: AC
  Filled 2020-10-02: qty 250

## 2020-10-02 MED ORDER — NALBUPHINE HCL 10 MG/ML IJ SOLN
5.0000 mg | INTRAMUSCULAR | Status: DC | PRN
  Filled 2020-10-02: qty 1

## 2020-10-02 MED ORDER — CELECOXIB 200 MG PO CAPS
400.0000 mg | ORAL_CAPSULE | ORAL | Status: AC
  Administered 2020-10-02: 400 mg via ORAL

## 2020-10-02 MED ORDER — KETOROLAC TROMETHAMINE 30 MG/ML IJ SOLN
30.0000 mg | Freq: Four times a day (QID) | INTRAMUSCULAR | Status: AC
  Administered 2020-10-02 – 2020-10-03 (×4): 30 mg via INTRAVENOUS
  Filled 2020-10-02 (×4): qty 1

## 2020-10-02 MED ORDER — CEFAZOLIN SODIUM-DEXTROSE 2-4 GM/100ML-% IV SOLN
INTRAVENOUS | Status: AC
  Filled 2020-10-02: qty 100

## 2020-10-02 MED ORDER — KETOROLAC TROMETHAMINE 30 MG/ML IJ SOLN
INTRAMUSCULAR | Status: AC
  Filled 2020-10-02: qty 1

## 2020-10-02 MED ORDER — SOD CITRATE-CITRIC ACID 500-334 MG/5ML PO SOLN
ORAL | Status: AC
  Filled 2020-10-02: qty 30

## 2020-10-02 MED ORDER — ACETAMINOPHEN 10 MG/ML IV SOLN
INTRAVENOUS | Status: AC
  Filled 2020-10-02: qty 100

## 2020-10-02 MED ORDER — PRENATAL MULTIVITAMIN CH
1.0000 | ORAL_TABLET | Freq: Every day | ORAL | Status: DC
  Administered 2020-10-03 – 2020-10-05 (×3): 1 via ORAL
  Filled 2020-10-02 (×3): qty 1

## 2020-10-02 MED ORDER — ACETAMINOPHEN 500 MG PO TABS
1000.0000 mg | ORAL_TABLET | Freq: Three times a day (TID) | ORAL | Status: DC
  Administered 2020-10-02 – 2020-10-05 (×9): 1000 mg via ORAL
  Filled 2020-10-02 (×9): qty 2

## 2020-10-02 MED ORDER — SENNOSIDES-DOCUSATE SODIUM 8.6-50 MG PO TABS
2.0000 | ORAL_TABLET | ORAL | Status: DC
  Administered 2020-10-03 – 2020-10-04 (×2): 2 via ORAL
  Filled 2020-10-02 (×2): qty 2

## 2020-10-02 MED ORDER — PHENYLEPHRINE HCL-NACL 20-0.9 MG/250ML-% IV SOLN
INTRAVENOUS | Status: DC | PRN
  Administered 2020-10-02: 50 ug/min via INTRAVENOUS

## 2020-10-02 MED ORDER — TETANUS-DIPHTH-ACELL PERTUSSIS 5-2.5-18.5 LF-MCG/0.5 IM SUSY: 0.5000 mL | PREFILLED_SYRINGE | Freq: Once | INTRAMUSCULAR | Status: DC

## 2020-10-02 MED ORDER — DIPHENHYDRAMINE HCL 25 MG PO CAPS: 25.0000 mg | ORAL_CAPSULE | Freq: Four times a day (QID) | ORAL | Status: DC | PRN

## 2020-10-02 MED ORDER — SODIUM CHLORIDE 0.9% FLUSH: 3.0000 mL | INTRAVENOUS | Status: DC | PRN

## 2020-10-02 MED ORDER — PROMETHAZINE HCL 25 MG/ML IJ SOLN: 6.2500 mg | INTRAMUSCULAR | Status: DC | PRN

## 2020-10-02 MED ORDER — NALOXONE HCL 4 MG/10ML IJ SOLN
1.0000 ug/kg/h | INTRAVENOUS | Status: DC | PRN
  Filled 2020-10-02: qty 5

## 2020-10-02 MED ORDER — OXYCODONE HCL 5 MG PO TABS
5.0000 mg | ORAL_TABLET | ORAL | Status: DC | PRN
  Administered 2020-10-04 – 2020-10-05 (×4): 5 mg via ORAL
  Filled 2020-10-02 (×5): qty 1

## 2020-10-02 MED ORDER — FENTANYL CITRATE (PF) 100 MCG/2ML IJ SOLN
INTRAMUSCULAR | Status: DC | PRN
  Administered 2020-10-02: 15 ug via INTRATHECAL

## 2020-10-02 MED ORDER — COCONUT OIL OIL
1.0000 "application " | TOPICAL_OIL | Status: DC | PRN
  Administered 2020-10-02: 1 via TOPICAL

## 2020-10-02 SURGICAL SUPPLY — 32 items
BENZOIN TINCTURE PRP APPL 2/3 (GAUZE/BANDAGES/DRESSINGS) ×2 IMPLANT
CHLORAPREP W/TINT 26ML (MISCELLANEOUS) ×2 IMPLANT
CLAMP CORD UMBIL (MISCELLANEOUS) IMPLANT
CLOTH BEACON ORANGE TIMEOUT ST (SAFETY) ×2 IMPLANT
DRAIN JACKSON PRT FLT 7MM (DRAIN) IMPLANT
DRSG OPSITE POSTOP 4X10 (GAUZE/BANDAGES/DRESSINGS) ×2 IMPLANT
ELECT REM PT RETURN 9FT ADLT (ELECTROSURGICAL) ×2
ELECTRODE REM PT RTRN 9FT ADLT (ELECTROSURGICAL) ×1 IMPLANT
EVACUATOR SILICONE 100CC (DRAIN) IMPLANT
EXTRACTOR VACUUM M CUP 4 TUBE (SUCTIONS) IMPLANT
GLOVE BIO SURGEON STRL SZ7 (GLOVE) ×2 IMPLANT
GLOVE BIOGEL PI IND STRL 7.0 (GLOVE) ×2 IMPLANT
GLOVE BIOGEL PI INDICATOR 7.0 (GLOVE) ×2
GOWN STRL REUS W/TWL LRG LVL3 (GOWN DISPOSABLE) ×4 IMPLANT
KIT ABG SYR 3ML LUER SLIP (SYRINGE) IMPLANT
NEEDLE HYPO 25X5/8 SAFETYGLIDE (NEEDLE) ×2 IMPLANT
NS IRRIG 1000ML POUR BTL (IV SOLUTION) ×2 IMPLANT
PACK C SECTION WH (CUSTOM PROCEDURE TRAY) ×2 IMPLANT
PAD OB MATERNITY 4.3X12.25 (PERSONAL CARE ITEMS) ×2 IMPLANT
PENCIL SMOKE EVAC W/HOLSTER (ELECTROSURGICAL) ×2 IMPLANT
RTRCTR C-SECT PINK 25CM LRG (MISCELLANEOUS) ×2 IMPLANT
STRIP CLOSURE SKIN 1/2X4 (GAUZE/BANDAGES/DRESSINGS) ×2 IMPLANT
SUT PLAIN 2 0 XLH (SUTURE) ×2 IMPLANT
SUT VIC AB 0 CTX 36 (SUTURE) ×5
SUT VIC AB 0 CTX36XBRD ANBCTRL (SUTURE) ×5 IMPLANT
SUT VIC AB 2-0 CT1 27 (SUTURE) ×1
SUT VIC AB 2-0 CT1 TAPERPNT 27 (SUTURE) ×1 IMPLANT
SUT VIC AB 4-0 KS 27 (SUTURE) ×2 IMPLANT
SYR KIT LINE DRAW 1CC W/FILTR (LINER) ×2 IMPLANT
TOWEL OR 17X24 6PK STRL BLUE (TOWEL DISPOSABLE) ×2 IMPLANT
TRAY FOLEY W/BAG SLVR 14FR LF (SET/KITS/TRAYS/PACK) ×2 IMPLANT
WATER STERILE IRR 1000ML POUR (IV SOLUTION) ×2 IMPLANT

## 2020-10-02 NOTE — Op Note (Signed)
Operative Note   SURGERY DATE: 10/02/2020  PRE-OP DIAGNOSIS:  *Pregnancy at [redacted]w[redacted]d  *History of one prior cesarean section   POST-OP DIAGNOSIS: Same   PROCEDURE: Repeat low transverse cesarean section via pfannenstiel skin incision with double layer uterine closure  SURGEON: Surgeon(s) and Role:    * Lesly Dukes, MD - Primary    * Deonte Otting, Arlana Pouch, MD - Assisting  ANESTHESIA: spinal  ESTIMATED BLOOD LOSS: 392cc  DRAINS: UOP via indwelling foley  TOTAL IV FLUIDS: crystalloid  VTE PROPHYLAXIS: SCDs to bilateral lower extremities  ANTIBIOTICS: Two grams of Cefazolin were given., within 1 hour of skin incision  SPECIMENS: Placenta to L&D   COMPLICATIONS: none  INDICATIONS: Declines vaginal attempt   FINDINGS: Scarring of fascia, no intra-abdominal adhesions were noted. Grossly normal uterus, tubes and ovaries.  amniotic fluid, cephalic female infant, weight pending, APGARs 8/9, intact placenta.  PROCEDURE IN DETAIL: The patient was taken to the operating room where anesthesia was administered and normal fetal heart tones were confirmed. She was then prepped and draped in the normal fashion in the dorsal supine position with a leftward tilt.  After a time out was performed, a pfannensteil skin incision was made with the scalpel and carried through to the underlying layer of fascia with the Bovie. The fascia was then incised at the midline and this incision was extended laterally with the mayo scissors. Attention was turned to the superior aspect of the fascial incision which was grasped with the kocher clamps x 2, tented up and the rectus muscles were dissected off with the bovie. In a similar fashion the inferior aspect of the fascial incision was grasped with the kocher clamps, tented up and the rectus muscles dissected off bluntly. The rectus muscles were then separated in the midline and the peritoneum was entered bluntly. The Alexis retractor was inserted and the  vesicouterine peritoneum was identified, tented up and entered with the metzenbaum scissors. This incision was extended laterally and the bladder flap was created digitally. The bladder blade was inserted.  A low transverse hysterotomy was made with the scalpel until the endometrial cavity was breached and the amniotic sac ruptured with the Allis clamp, yielding clear amniotic fluid. This incision was extended bluntly and the infant's head, shoulders and body were delivered atraumatically.The cord was clamped x 2 and cut, and the infant was handed to the awaiting pediatricians, after delayed cord clamping was done.  The placenta was then gradually expressed from the uterus and then the uterus was exteriorized and cleared of all clots and debris. The hysterotomy was repaired with a running suture of 1-0 vicryl. A second imbricating layer of 1-0 vicryl suture was then placed.    The uterus and adnexa were then returned to the abdomen, and the hysterotomy and all operative sites were reinspected and excellent hemostasis was noted.  The peritoneum was closed with a running stitch of 2-0 Vicryl. The fascia was reapproximated with 0 Vicryl in a simple running fashion bilaterally. The subcutaneous layer was then reapproximated with interrupted sutures of 2-0 plain gut, and the skin was then closed with 4-0 vicryl, in a subcuticular fashion.  The patient  tolerated the procedure well. Sponge, lap, needle, and instrument counts were correct x 2. The patient was transferred to the recovery room awake, alert and breathing independently in stable condition.   Casper Harrison, MD [facility] Fellow, [facility] for [facility], [facility]

## 2020-10-02 NOTE — Anesthesia Preprocedure Evaluation (Signed)
Anesthesia Evaluation  Patient identified by MRN, date of birth, ID band Patient awake    Reviewed: Allergy & Precautions, H&P , NPO status , Patient's Chart, lab work & pertinent test results  History of Anesthesia Complications Negative for: history of anesthetic complications  Airway Mallampati: II  TM Distance: >3 FB     Dental   Pulmonary asthma ,    Pulmonary exam normal        Cardiovascular negative cardio ROS   Rhythm:regular Rate:Normal     Neuro/Psych negative neurological ROS  negative psych ROS   GI/Hepatic negative GI ROS, Neg liver ROS,   Endo/Other  diabetes, Gestational  Renal/GU negative Renal ROS  negative genitourinary   Musculoskeletal   Abdominal   Peds  Hematology negative hematology ROS (+)        Component                Value               Date/Time                 HGB                      12.9                09/30/2020 1007           HGB                      12.1                07/14/2020 0818           HCT                      37.6                09/30/2020 1007           HCT                      34.7                07/14/2020 0818           PLT                      267                 09/30/2020 1007           PLT                      278                 07/14/2020 0818        Anesthesia Other Findings  Echo 07/17/20: The interatrial septum appears aneurysmal, however, no clear evidence of PFO on color doppler interrogation. EF 60-65%, normal diastolic function. Normal RV function. No significant valve disease.   Reproductive/Obstetrics (+) Pregnancy G4P1021 at [redacted]w[redacted]d                             Anesthesia Physical Anesthesia Plan  ASA: 2  Anesthesia Plan: Spinal   Post-op Pain Management:    Induction:   PONV Risk Score and Plan: Ondansetron and Treatment may vary due to age or medical condition  Airway Management Planned:  Additional Equipment:    Intra-op Plan:   Post-operative Plan:   Informed Consent: I have reviewed the patients History and Physical, chart, labs and discussed the procedure including the risks, benefits and alternatives for the proposed anesthesia with the patient or authorized representative who has indicated his/her understanding and acceptance.       Plan Discussed with: Anesthesiologist  Anesthesia Plan Comments:         Anesthesia Quick Evaluation

## 2020-10-02 NOTE — H&P (Signed)
Claudia Lambert is a 35 y.o. [id number] with IUP at 71w2dpresenting for scheduled cesarean section.  Reports good fetal movement, no bleeding, no contractions, no leaking of fluid.  No acute preoperative concerns.    Cesarean Section Indication: patient declines vag del attempt  Prenatal Course Source of Care: [facility]    Pregnancy complications or risks: Patient Active Problem List   Diagnosis Date Noted   AMA (advanced maternal age) multigravida 377+ third trimester 09/26/2020   Supervision of high risk pregnancy due to social problems in third trimester 09/26/2020   GDM (gestational diabetes mellitus) 07/16/2020   Large placenta lake 07/14/2020   Pelvic pain affecting pregnancy in third trimester, antepartum 07/14/2020   Abnormal echocardiogram 06/12/2020   History of cesarean delivery 06/11/2020   Poor fetal growth affecting management of mother in second trimester 06/11/2020   Supervision of high risk pregnancy, antepartum 04/10/2020   Asthma 04/10/2020   Marijuana use 03/04/2020   She plans to breastfeed She is undecided for birth control  Prenatal labs and studies: ABO, Rh: --/--/O POS (06/28 1012) Antibody: NEG (06/28 1012) Rubella: 4.49 (01/12 1618) RPR: NON REACTIVE (06/28 1007)  HBsAg: Negative (01/12 1618)  HIV: Non Reactive (04/11 0818)  GSHU:OHFGBMSX/- (06/09 1625) 2 hr Glucola  95/129/106  Genetic screening normal Anatomy UKoreanormal  Prenatal Transfer Tool  Maternal Diabetes: Yes - diet controlled GDM  Genetic Screening: Normal Maternal Ultrasounds/Referrals: Normal Fetal Ultrasounds or other Referrals:  None Maternal Substance Abuse:  Yes:  Type: Marijuana Significant Maternal Medications:  None Significant Maternal Lab Results: None  Past Medical History:  Diagnosis Date   Asthma    Eczema    GDM (gestational diabetes mellitus) 07/16/2020   History of diet controlled gestational diabetes mellitus (GDM) 04/10/2020    Infection    UTI    Past Surgical History:  Procedure Laterality Date   CESAREAN SECTION     Placenta Previa    OB History  Gravida Para Term Preterm AB Living  4 1 1  0 2 1  SAB IAB Ectopic Multiple Live Births  1 1     1     # Outcome Date GA Lbr Len/2nd Weight Sex Delivery Anes PTL Lv  4 Current           3 Term 09/19/16 [id number] g M CS-Unspec  N LIV     Complications: Placenta Previa  2 IAB           1 SAB             Social History   Socioeconomic History   Marital status: Single    Spouse name: Not on file   Number of children: 1   Years of education: Not on file   Highest education level: Not on file  Occupational History   Occupation: works for [employer]  Tobacco Use   Smoking status: Never   Smokeless tobacco: Never  Vaping Use   Vaping Use: Never used  Substance and Sexual Activity   Alcohol use: Not Currently    Comment: occ, "not really a drinker"   Drug use: Not Currently    Types: Marijuana    Comment: last was Sept 2021   Sexual activity: Yes    Birth control/protection: None  Other Topics Concern   Not on file  Social History Narrative   Not on file   Social Determinants of Health   Financial Resource Strain: Not on file  Food Insecurity:  No Food Insecurity   Worried About Charity fundraiser in the Last Year: Never true   Ran Out of Food in the Last Year: Never true  Transportation Needs: No Transportation Needs   Lack of Transportation (Medical): No   Lack of Transportation (Non-Medical): No  Physical Activity: Not on file  Stress: Not on file  Social Connections: Not on file    Family History  Problem Relation Age of Onset   Hypertension Mother    Fibroids Mother     Medications Prior to Admission  Medication Sig Dispense Refill Last Dose   Prenatal Vit-Fe Fumarate-FA (PRENATAL VITAMIN PO) Take 1 tablet by mouth daily in the afternoon.   10/01/2020   Accu-Chek Softclix Lancets lancets Use as instructed 100 each 12 Not Taking    Blood Glucose Monitoring Suppl (ACCU-CHEK GUIDE) w/Device KIT 1 Device by Does not apply route in the morning, at noon, in the evening, and at bedtime. 1 kit 0 Not Taking   Blood Pressure Monitoring DEVI 1 each by Does not apply route once a week. 1 each 0 Not Taking   cyclobenzaprine (FLEXERIL) 5 MG tablet Take 1 tablet (5 mg total) by mouth at bedtime as needed for muscle spasms. (Patient not taking: Reported on 09/24/2020) 45 tablet 0 Not Taking   glucose blood test strip Use as instructed 100 each 12 Not Taking   Magnesium Oxide (MAG-OXIDE) 200 MG TABS Take 2 tablets (400 mg total) by mouth at bedtime. If that amount causes loose stools in the am, switch to 264m daily at bedtime. (Patient not taking: Reported on 09/24/2020) 60 tablet 3 Not Taking   terconazole (TERAZOL 7) 0.4 % vaginal cream Place 1 applicator vaginally at bedtime. Use for seven days (Patient not taking: Reported on 09/24/2020) 45 g 0 Not Taking    No Known Allergies  Review of Systems: Pertinent items noted in HPI and remainder of comprehensive ROS otherwise negative.  Physical Exam: BP 127/88   Pulse (!) 120   Temp 98.1 F (36.7 C) (Oral)   Resp 18   Ht 5' 6"  (1.676 m)   Wt 86.8 kg   LMP 01/01/2020   SpO2 100%   BMI 30.89 kg/m  FHR by Doppler: 135 bpm CONSTITUTIONAL: Well-developed, well-nourished female in no acute distress.  HENT:  Normocephalic, atraumatic, External right and left ear normal. Oropharynx is clear and moist EYES: Conjunctivae and EOM are normal. No scleral icterus.  NECK: Normal range of motion, supple, no masses SKIN: Skin is warm and dry.   [place] Alert and oriented to person, place, and time.  PSYCHIATRIC: Normal mood and affect. Normal behavior. Normal judgment and thought content. CARDIOVASCULAR: Normal heart rate noted RESPIRATORY: Effort and breath sounds normal, no problems with respiration noted ABDOMEN: Soft, nontender, nondistended, gravid.Well-healed Pfannenstiel  incision. PELVIC: Deferred     Pertinent Labs/Studies:   Results for orders placed or performed during the hospital encounter of 10/02/20 (from the past 72 hour(s))  Glucose, capillary     Status: None   Collection Time: 10/02/20  7:41 AM  Result Value Ref Range   Glucose-Capillary 99 70 - 99 mg/dL    Comment: Glucose reference range applies only to samples taken after fasting for at least 8 hours.    Assessment and Plan: Claudia Lambert a 35y.o. [id number] 354w2deing admitted for scheduled cesarean section. The risks of cesarean section discussed with the patient included but were not limited to: bleeding which may require transfusion or  reoperation; infection which may require antibiotics; injury to bowel, bladder, ureters or other surrounding organs; injury to the fetus; need for additional procedures including hysterectomy in the event of a life-threatening hemorrhage; placental abnormalities with subsequent pregnancies, incisional problems, thromboembolic phenomenon and other postoperative/anesthesia complications. The patient concurred with the proposed plan, giving informed written consent for the procedure. Patient has been NPO since last night she will remain NPO for procedure. Anesthesia and OR aware. Preoperative prophylactic antibiotics and SCDs ordered on call to the OR. To OR when ready.   Pregnancy Complications:   -marijuana use in pregnancy: SW consult pp -EFW 13%ile  -history of prior c section  -history of prior placenta previa and large placental lake noted on prior ultrasound, not commented upon on most recent ultrasound. Posterior placenta. TXA intra-operatively.  -Gestational Diabetes - reports CBG at goal. fasting CBG pp   Contraception: undecided, has been counseled on options and would like to consider further Circumcision: n/a  Sharene Skeans, MD [facility] Family Medicine Fellow, [facility] for [facility], [place]

## 2020-10-02 NOTE — Discharge Summary (Addendum)
Postpartum Discharge Summary    Patient Name: Claudia Lambert DOB: 1985-12-18 MRN: [id number]  Date of admission: 10/02/2020 Delivery date:10/02/2020  Delivering provider: Guss Bunde  Date of discharge: 10/05/2020  Admitting diagnosis: Cesarean delivery delivered [O82] Intrauterine pregnancy: [redacted]w[redacted]d    Secondary diagnosis:  Active Problems:   History of cesarean delivery   GDM (gestational diabetes mellitus)   Cesarean delivery delivered  Additional problems: as noted above  Discharge diagnosis: Term Pregnancy Delivered and GDM A1                                              Post partum procedures: none Augmentation: N/A Complications: None  Hospital course: Sceduled C/S   35y.o. yo [id number] 366w2das admitted to the hospital 10/02/2020 for scheduled cesarean section with the following indication:Elective Repeat.Delivery details are as follows:  Membrane Rupture Time/Date: 9:45 AM ,10/02/2020   Delivery Method:C-Section, Low Transverse  Details of operation can be found in separate operative note.  Patient had a postpartum course complicated by gestational diabetes.  She is ambulating, tolerating a regular diet, passing flatus, and urinating well. Also reports numbness of medial surface of right foot that is positional. None now. Ambulating normally. Normal sensation and strength. Instructed to call office if Sx worsen. Patient is discharged home in stable condition on  10/05/20        Newborn Data: Birth date:10/02/2020  Birth time:9:45 AM  Gender:Female  Living status:Living  Apgars:8 ,9  Weight:3050 g     Magnesium Sulfate received: No BMZ received: No Rhophylac:N/A MMR:N/A T-DaP: offered prior to discharge-pt refused  Flu: offered prior to discharge- declined  Transfusion:No  Physical exam  Vitals:   10/03/20 2143 10/04/20 0522 10/04/20 1920 10/05/20 0555  BP: 124/73 110/68 130/79 135/76  Pulse: 89 79 82 83  Resp: _0 Temp:  98.1 F (36.7 C) 98.4 F  (36.9 C) 97.6 F (36.4 C)  TempSrc: Oral Oral Oral Oral  SpO2: 100% 100% 100% 97%  Weight:      Height:       General: alert and cooperative. Mild distress w/ mvmt and during exam.  Lochia: appropriate Uterine Fundus: firm Incision: Healing well with no significant drainage, No significant erythema, Dressing is 30% saturated w/ old blood. No evidence of active bleeding.  DVT Evaluation: No evidence of DVT seen on physical exam. No cords or calf tenderness. No significant calf/ankle edema. Numbness in right foot, localized medially. Good pedal pulses. FROM. No swelling, no color change.  Labs: Lab Results  Component Value Date   WBC 13.4 (H) 10/03/2020   HGB 9.2 (L) 10/03/2020   HCT 27.4 (L) 10/03/2020   MCV 96.1 10/03/2020   PLT 211 10/03/2020   CMP Latest Ref Rng & Units 06/30/2020  Glucose 65 - 99 mg/dL 80  BUN 6 - 20 mg/dL 9  Creatinine 0.57 - 1.00 mg/dL 0.51(L)  Sodium 134 - 144 mmol/L 136  Potassium 3.5 - 5.2 mmol/L 4.2  Chloride 96 - 106 mmol/L 102  CO2 20 - 29 mmol/L 19(L)  Calcium 8.7 - 10.2 mg/dL 9.4  Total Protein 6.5 - 8.1 g/dL -  Total Bilirubin 0.3 - 1.2 mg/dL -  Alkaline Phos 38 - 126 U/L -  AST 15 - 41 U/L -  ALT 0 - 44 U/L -   Edinburgh  Score: Edinburgh Postnatal Depression Scale Screening Tool 10/03/2020  I have been able to laugh and see the funny side of things. 0  I have looked forward with enjoyment to things. 0  I have blamed myself unnecessarily when things went wrong. 0  I have been anxious or worried for no good reason. 0  I have felt scared or panicky for no good reason. 1  Things have been getting on top of me. 0  I have been so unhappy that I have had difficulty sleeping. 0  I have felt sad or miserable. 0  I have been so unhappy that I have been crying. 0  The thought of harming myself has occurred to me. 0  Edinburgh Postnatal Depression Scale Total 1     After visit meds:  Allergies as of 10/05/2020   No Known Allergies       Medication List     STOP taking these medications    Accu-Chek Guide w/Device Kit   Accu-Chek Softclix Lancets lancets   cyclobenzaprine 5 MG tablet Commonly known as: FLEXERIL   glucose blood test strip   Mag-Oxide 200 MG Tabs Generic drug: Magnesium Oxide   PRENATAL VITAMIN PO Replaced by: prenatal vitamin w/FE, FA 29-1 MG Chew chewable tablet   terconazole 0.4 % vaginal cream Commonly known as: TERAZOL 7       TAKE these medications    acetaminophen 500 MG tablet Commonly known as: TYLENOL Take 2 tablets (1,000 mg total) by mouth every 6 (six) hours as needed for mild pain, moderate pain, fever or headache.   ascorbic acid 250 MG tablet Commonly known as: VITAMIN C Take 1 tablet (250 mg total) by mouth every other day.   Blood Pressure Monitoring Devi 1 each by Does not apply route once a week.   coconut oil Oil Apply 1 application topically as needed (nipple pain).   ferrous sulfate 325 (65 FE) MG tablet Take 1 tablet (325 mg total) by mouth every other day.   ibuprofen 600 MG tablet Commonly known as: ADVIL Take 1 tablet (600 mg total) by mouth every 6 (six) hours.   oxyCODONE 5 MG immediate release tablet Commonly known as: Oxy IR/ROXICODONE Take 1-2 tablets (5-10 mg total) by mouth every 6 (six) hours as needed for severe pain or breakthrough pain.   polyethylene glycol powder 17 GM/SCOOP powder Commonly known as: GLYCOLAX/MIRALAX Take 17 g by mouth 2 (two) times daily as needed for moderate constipation.   prenatal vitamin w/FE, FA 29-1 MG Chew chewable tablet Chew 1 tablet by mouth daily at 12 noon. Replaces: PRENATAL VITAMIN PO         Discharge home in stable condition Infant Feeding: Breast Infant Disposition:home with mother Discharge instruction: per After Visit Summary and Postpartum booklet. Activity: Advance as tolerated. Pelvic rest for 6 weeks.  Diet: routine diet Future Appointments: Future Appointments  Date Time  Provider [place]  10/13/2020  2:30 PM [facility] NURSE [facility]  11/03/2020  8:15 AM Donnamae Jude, MD [facility]  11/03/2020  8:50 AM [facility] [place]   Follow up Visit:  [place] [facility] Follow up.   Specialty: Obstetrics and Gynecology Why: As scheduled in 4-6 weeks for postpartum visit Contact information: [place] [address] [phone]                 Please schedule this patient for  a In person postpartum visit in 4 weeks with the following provider: Any provider. Additional Postpartum F/U:2 hour GTT at postpartum and Incision check 1 week  Low risk pregnancy complicated by: GDM Delivery mode:  C-Section, Low Transverse  Anticipated Birth Control:  Unsure-Pt interest in nonhormonal options, Phexxi was discussed.    Erskine Emery, MD, PGY1   I was present for the exam and agree with above.  Tamala Julian, [place], [place] 10/05/2020 1:09 PM

## 2020-10-02 NOTE — Transfer of Care (Signed)
Immediate Anesthesia Transfer of Care Note  Patient: Tyana Graw  Procedure(s) Performed: CESAREAN SECTION  Patient Location: PACU  Anesthesia Type:Spinal  Level of Consciousness: awake, alert , patient cooperative and responds to stimulation  Airway & Oxygen Therapy: Patient Spontanous Breathing  Post-op Assessment: Report given to RN and Post -op Vital signs reviewed and stable  Post vital signs: Reviewed and stable  Last Vitals:  Vitals Value Taken Time  BP 128/78 10/02/20 1037  Temp    Pulse 82 10/02/20 1041  Resp 19 10/02/20 1041  SpO2 100 % 10/02/20 1041  Vitals shown include unvalidated device data.  Last Pain:  Vitals:   10/02/20 0741  TempSrc: Oral         Complications: No notable events documented.

## 2020-10-02 NOTE — Lactation Note (Signed)
This note was copied from a baby's chart. Lactation Consultation Note  Patient Name: Claudia Lambert Today's Date: 10/02/2020 Reason for consult: Initial assessment Age:35 years old  Initial visit to 7 hours old infant of a P2 mother with breastfeeding experience. Mother states infant just finished breastfeeding ~15-min. LC did not observe latch. Reviewed hand expression. Mother requests a hand pump for supplementation purposes. Talked about pacifiers and discouraged use until breastfeeding is well established.   Plan: 1-Skin to skin 2-Aim for a deep, comfortable latch 3-Breastfeeding on demand or 8-12 times in 24h period. 4-Keep infant awake during breastfeeding session: massaging breast, infant's hand/shoulder/feet 5-Monitor voids and stools as signs good intake.  6-Encouraged maternal rest, hydration and food intake.  7-Contact LC as needed for feeds/support/concerns/questions   All questions answered at this time. Provided Lactation services brochure and promoted [employer] booklet information. tea  Maternal Data Has patient been taught Hand Expression?: Yes Does the patient have breastfeeding experience prior to this delivery?: Yes How long did the patient breastfeed?: 6-7 months  Feeding Mother's Current Feeding Choice: Breast Milk  Lactation Tools Discussed/Used Tools: Pump;Flanges Flange Size: 24 Breast pump type: Manual Pump Education: Setup, frequency, and cleaning;Milk Storage Reason for Pumping: mother's request for stimulation and supplementation Pumping frequency: as needed  Interventions Interventions: Breast feeding basics reviewed;Skin to skin;Breast massage;Hand express;Hand pump;Expressed milk;Education  Discharge Pump: Personal;Manual WIC Program: Yes  Consult Status Consult Status: Follow-up Date: 10/03/20 Follow-up type: In-patient    Iline Buchinger A Higuera [place] 10/02/2020, 4:52 PM

## 2020-10-02 NOTE — Anesthesia Procedure Notes (Signed)
Spinal  Patient location during procedure: OR Reason for block: surgical anesthesia Staffing Performed: anesthesiologist  Anesthesiologist: Sherard Sutch E, MD Preanesthetic Checklist Completed: patient identified, IV checked, risks and benefits discussed, surgical consent, monitors and equipment checked, pre-op evaluation and timeout performed Spinal Block Patient position: sitting Prep: DuraPrep and site prepped and draped Patient monitoring: continuous pulse ox, blood pressure and heart rate Approach: midline Location: L3-4 Injection technique: single-shot Needle Needle type: Pencan  Needle gauge: 24 G Needle length: 9 cm Assessment Events: CSF return Additional Notes Functioning IV was confirmed and monitors were applied. Sterile prep and drape, including hand hygiene and sterile gloves were used. The patient was positioned and the spine was prepped. The skin was anesthetized with lidocaine.  Free flow of clear CSF was obtained prior to injecting local anesthetic into the CSF. The needle was carefully withdrawn. The patient tolerated the procedure well.     

## 2020-10-02 NOTE — Anesthesia Postprocedure Evaluation (Signed)
Anesthesia Post Note  Patient: Claudia Lambert  Procedure(s) Performed: CESAREAN SECTION     Patient location during evaluation: PACU Anesthesia Type: Spinal Level of consciousness: oriented and awake and alert Pain management: pain level controlled Vital Signs Assessment: post-procedure vital signs reviewed and stable Respiratory status: spontaneous breathing, respiratory function stable and nonlabored ventilation Cardiovascular status: blood pressure returned to baseline and stable Postop Assessment: no headache, no backache, no apparent nausea or vomiting and spinal receding Anesthetic complications: no   No notable events documented.  Last Vitals:  Vitals:   10/02/20 1130 10/02/20 1141  BP: 125/85 115/79  Pulse: 82 79  Resp: 18 18  Temp:  36.5 C  SpO2: 100% 100%    Last Pain:  Vitals:   10/02/20 1115  TempSrc:   PainSc: 0-No pain   Pain Goal:    LLE Motor Response: Purposeful movement (10/02/20 1130)   RLE Motor Response: Purposeful movement (10/02/20 1130)       Epidural/Spinal Function Cutaneous sensation: Able to Discern Pressure (10/02/20 1130), Patient able to flex knees: Yes (10/02/20 1130), Patient able to lift hips off bed: No (10/02/20 1130), Back pain beyond tenderness at insertion site: No (10/02/20 1130), Progressively worsening motor and/or sensory loss: No (10/02/20 1130), Bowel and/or bladder incontinence post epidural: No (10/02/20 1130)  [name]

## 2020-10-03 LAB — CBC
HCT: 27.4 % — ABNORMAL LOW (ref 36.0–46.0)
Hemoglobin: 9.2 g/dL — ABNORMAL LOW (ref 12.0–15.0)
MCH: 32.3 pg (ref 26.0–34.0)
MCHC: 33.6 g/dL (ref 30.0–36.0)
MCV: 96.1 fL (ref 80.0–100.0)
Platelets: 211 10*3/uL (ref 150–400)
RBC: 2.85 MIL/uL — ABNORMAL LOW (ref 3.87–5.11)
RDW: 12.8 % (ref 11.5–15.5)
WBC: 13.4 10*3/uL — ABNORMAL HIGH (ref 4.0–10.5)
nRBC: 0 % (ref 0.0–0.2)

## 2020-10-03 LAB — GLUCOSE, CAPILLARY: Glucose-Capillary: 94 mg/dL (ref 70–99)

## 2020-10-03 LAB — BIRTH TISSUE RECOVERY COLLECTION (PLACENTA DONATION)

## 2020-10-03 MED ORDER — FERROUS SULFATE 325 (65 FE) MG PO TABS
325.0000 mg | ORAL_TABLET | ORAL | Status: DC
  Administered 2020-10-03 – 2020-10-05 (×2): 325 mg via ORAL
  Filled 2020-10-03 (×2): qty 1

## 2020-10-03 MED ORDER — ASCORBIC ACID 500 MG PO TABS
250.0000 mg | ORAL_TABLET | ORAL | Status: DC
  Administered 2020-10-03: 250 mg via ORAL
  Filled 2020-10-03 (×2): qty 1

## 2020-10-03 NOTE — Progress Notes (Signed)
Post-Op Day 1, repeat CS for elective  Subjective: No complaints, up ad lib, voiding and tolerating PO, passing flatus,small lochia,.plans to breastfeed,  plans outpt contraception  Objective: Blood pressure 120/69, pulse 76, temperature 97.9 F (36.6 C), temperature source Oral, resp. rate 18, height 5\' 6"  (1.676 m), weight 86.8 kg, last menstrual period 01/01/2020, SpO2 100 %, unknown if currently breastfeeding.  Physical Exam:  General: alert, cooperative and no distress Lochia:normal flow Chest: CTAB Heart: RRR no m/r/g Abdomen: +BS, soft, nontender, dsg c/d/intact,Uterine Fundus: firm DVT Evaluation: No evidence of DVT seen on physical exam. Extremities: trace edema  Recent Labs    09/30/20 1007 10/03/20 0517  HGB 12.9 9.2*  HCT 37.6 27.4*    Assessment/Plan: Plan for discharge tomorrow, Breastfeeding, and Lactation consult Oral iron  LOS: 1 day   12/04/20 10/03/2020, 7:57 AM

## 2020-10-03 NOTE — Clinical Social Work Maternal (Signed)
CLINICAL SOCIAL WORK MATERNAL/CHILD NOTE  Patient Details  Name: Claudia Lambert MRN: [id number] Date of Birth: 12/24/1985  Date:  10/03/2020  Clinical Social Worker Initiating Note:  Malinda Mayden, LCSWA Date/Time: Initiated:  10/03/20/1030     Child's Name:  Claudia Lambert   Biological Parents:  Mother, Father (Stanley Lambert)   Need for Interpreter:  None   Reason for Referral:  Current Substance Use/Substance Use During Pregnancy     Address:  [address] [place] [address]    Phone number:  [phone] (home)     Additional phone number:   Household Members/Support Persons (HM/SP):   Household Member/Support Person 1, Household Member/Support Person 2   HM/SP Name Relationship DOB or Age  HM/SP -1 Stanley Moulton Peddie Spouse 03/07/1985  HM/SP -2 [name] Lambert Son 09/21/2016  HM/SP -3        HM/SP -4        HM/SP -5        HM/SP -6        HM/SP -7        HM/SP -8          Natural Supports (not living in the home):  Parent   Professional Supports: None   Employment: Full-time   Type of Work: [employer]   Education:  High school graduate   Homebound arranged:    Financial Resources:  Medicaid   Other Resources:  WIC, Food Stamps     Cultural/Religious Considerations Which May Impact Care:    Psychotropic Medications:         Pediatrician:    [place] area  Pediatrician List:   [place] ([place])  [place]    [place]    [place]    [place]    [place]      Pediatrician Fax Number:    Risk Factors/Current Problems:  None   Cognitive State:  Alert  , Linear Thinking     Mood/Affect:  Flat     CSW Assessment: CSW consulted for THC use. CSW met with MOB to complete assessment and offer support. CSW introduced self and role. Infant was observed sleeping in bassinet. CSW informed MOB of reason for consult. MOB had a flat affect and reported she stopped smoking  when she found out she was pregnant in October 2021. MOB denies any additional substance use. CSW informed MOB of the hospital drug screen policy. MOB aware infant's UDS is negative and the CDS will be followed. CSW informed MOB a CPS report will be made if infant test positive for substances. MOB was understanding and denied any questions.  MOB stated she resides with FOB and her son. MOB is employed, receiving both WIC and food stamps. MOB aware WIC can be contacted to have infant added to services. MOB denies any mental health diagnosis and reported she is currently doing well and had a good pregnancy.   CSW provided education regarding the baby blues period versus PPD and provided resources. CSW provided the New Mom Checklist and encouraged MOB to self evaluate and contact a medical professional if symptoms are noted at any time.   CSW provided review of Sudden Infant Death Syndrome (SIDS) precautions. MOB has all needs for infant. MOB has no barriers to follow-up care. MOB has no additional needs at this time.   CSW will continue to follow CDS and make a CPS report if warranted. CSW identifies no further need for intervention and no barriers to discharge   at this time.  CSW Plan/Description:  No Further Intervention Required/No Barriers to Discharge, Child Protective Service Report  , Hospital Drug Screen Policy Information, CSW Will Continue to Monitor Umbilical Cord Tissue Drug Screen Results and Make Report if Warranted, Perinatal Mood and Anxiety Disorder (PMADs) Education, Sudden Infant Death Syndrome (SIDS) Education, Other Information/Referral to Community Resources    Terrie Haring J Kendelle Schweers, LCSWA 10/03/2020, 11:38 AM 

## 2020-10-03 NOTE — Progress Notes (Signed)
Subjective: Postpartum Day 1: Cesarean Delivery Patient reports incisional pain and tolerating PO.    Objective: Vital signs in last 24 hours: Temp:  [97.6 F (36.4 C)-98.4 F (36.9 C)] 97.9 F (36.6 C) (07/01 0546) Pulse Rate:  [72-92] 76 (07/01 0546) Resp:  [15-18] 18 (07/01 0546) BP: (110-130)/(60-85) 120/69 (07/01 0546) SpO2:  [98 %-100 %] 100 % (07/01 0546)  Physical Exam:  General: alert, cooperative, and no distress Lochia: appropriate Uterine Fundus: firm Incision: no significant drainage DVT Evaluation: No evidence of DVT seen on physical exam.  Recent Labs    09/30/20 1007 10/03/20 0517  HGB 12.9 9.2*  HCT 37.6 27.4*    Assessment/Plan: Status post Cesarean section. Doing well postoperatively.  Continue current care.  Wynelle Bourgeois 10/03/2020, 7:53 AM

## 2020-10-04 NOTE — Lactation Note (Signed)
This note was copied from a baby's chart. Lactation Consultation Note  Patient Name: Claudia Lambert UUVOZ'D Date: 10/04/2020 Reason for consult: Follow-up assessment Age:35 hours  LC in to room for follow up. Mother states breastfeeding is going well. Talked about weight loss, voids and stools as signs of good milk transfer. Provided comfort gels for nipple soreness.    Plan: 1-Skin to skin 2-Aim for a deep, comfortable latch 3-Breastfeeding on demand or 8-12 times in 24h period. 4-Keep infant awake during breastfeeding session: massaging breast, infant's hand/shoulder/feet 5-Monitor voids and stools as signs good intake.  6-Encouraged maternal rest, hydration and food intake.  7-Contact LC as needed for feeds/support/concerns/questions   All questions answered at this time.    Feeding Mother's Current Feeding Choice: Breast Milk  Lactation Tools Discussed/Used Tools: Pump Breast pump type: Double-Electric Breast Pump  Interventions Interventions: Breast feeding basics reviewed;Education;Expressed milk;Comfort gels  Discharge Discharge Education: Engorgement and breast care  Consult Status Consult Status: Follow-up Date: 10/05/20 Follow-up type: In-patient    Cana Mignano A Higuera Ancidey 10/04/2020, 6:10 PM

## 2020-10-04 NOTE — Progress Notes (Addendum)
Subjective: Postpartum Day 2: Cesarean Delivery Patient reports incisional pain, tolerating PO, + flatus, and no problems voiding.    Objective: Vital signs in last 24 hours: Temp:  [98.1 F (36.7 C)-98.2 F (36.8 C)] 98.1 F (36.7 C) (07/02 0522) Pulse Rate:  [79-89] 79 (07/02 0522) Resp:  [17-18] 18 (07/02 0522) BP: (110-124)/(67-73) 110/68 (07/02 0522) SpO2:  [100 %] 100 % (07/02 0522)  Physical Exam:  General: alert, cooperative, and no distress Lochia: appropriate Uterine Fundus: firm Incision: no significant drainage DVT Evaluation: No evidence of DVT seen on physical exam. Negative Homan's sign. No cords or calf tenderness. No significant calf/ankle edema.  Recent Labs    10/03/20 0517  HGB 9.2*  HCT 27.4*    Assessment/Plan: Status post Cesarean section. Postoperative course complicated by pain management.   Continue current care but pt to consider discharge today if pain well controlled with PO medications by this afternoon. Pt interested in Phexxi or other nonhormonal contraceptive options.  Sharen Counter 10/04/2020, 7:42 AM

## 2020-10-05 MED ORDER — ACETAMINOPHEN 500 MG PO TABS: 1000.0000 mg | ORAL_TABLET | Freq: Four times a day (QID) | ORAL | 0 refills | Status: DC | PRN

## 2020-10-05 MED ORDER — OXYCODONE HCL 5 MG PO TABS: 5.0000 mg | ORAL_TABLET | Freq: Four times a day (QID) | ORAL | 0 refills | Status: DC | PRN

## 2020-10-05 MED ORDER — POLYETHYLENE GLYCOL 3350 17 GM/SCOOP PO POWD: 17.0000 g | Freq: Two times a day (BID) | ORAL | 2 refills | Status: DC | PRN

## 2020-10-05 MED ORDER — IBUPROFEN 600 MG PO TABS: 600.0000 mg | ORAL_TABLET | Freq: Four times a day (QID) | ORAL | 1 refills | Status: DC

## 2020-10-05 MED ORDER — ASCORBIC ACID 250 MG PO TABS: 250.0000 mg | ORAL_TABLET | ORAL | 1 refills | Status: DC

## 2020-10-05 MED ORDER — COMPLETENATE 29-1 MG PO CHEW: 1.0000 | CHEWABLE_TABLET | Freq: Every day | ORAL | 12 refills | Status: DC

## 2020-10-05 MED ORDER — COCONUT OIL OIL: 1.0000 "application " | TOPICAL_OIL | 0 refills | Status: DC | PRN

## 2020-10-05 MED ORDER — FERROUS SULFATE 325 (65 FE) MG PO TABS: 325.0000 mg | ORAL_TABLET | ORAL | 2 refills | Status: AC

## 2020-10-05 NOTE — Lactation Note (Signed)
This note was copied from a baby's chart. Lactation Consultation Note  Patient Name: Claudia Lambert IWOEH'O Date: 10/05/2020 Reason for consult: Follow-up assessment Age:35 hours  P2 mother whose infant is now 23 hours old.  This is a term baby at 39+2 weeks.  Baby was swaddled and asleep when I arrived.,  Mother had no questions/concerns related to breast feeding.  She feels like baby is latching well; no pain with feeding. Baby is voiding/stooling well.  Mother has been caring for her nipples with coconut oil and comfort gels.  Engorgement prevention reviewed.  Mother has a pump for home use.  Support person present.  She has our OP phone number for any concerns after discharge.   Maternal Data    Feeding    LATCH Score                    Lactation Tools Discussed/Used    Interventions    Discharge Discharge Education: Engorgement and breast care Pump: Personal  Consult Status Consult Status: Complete Date: 10/05/20 Follow-up type: Call as needed    Ritha Sampedro R Amatullah Christy 10/05/2020, 7:58 AM

## 2020-10-05 NOTE — Progress Notes (Addendum)
Patient requested Rn call provider due to her feeling numbness in her right foot. Patient states that it has been numb since 2pm on Saturday. Patient has good pedal pulses . Able to wiggle toes and no swelling is noted.Patient is ambulating well. Dr. Janeece Agee states to continue to monitor and sh will see the patient in the morning. Patient has scd's on. Will continue to monitor

## 2020-10-13 ENCOUNTER — Ambulatory Visit (INDEPENDENT_AMBULATORY_CARE_PROVIDER_SITE_OTHER): Payer: Medicaid Other

## 2020-10-13 ENCOUNTER — Telehealth (HOSPITAL_COMMUNITY): Payer: Self-pay | Admitting: *Deleted

## 2020-10-13 ENCOUNTER — Other Ambulatory Visit: Payer: Self-pay

## 2020-10-13 NOTE — Telephone Encounter (Signed)
Patient voiced no concerns for her health at this time. EPDS score = 0. Patient requested RN send information about virtual Baby and Me class. Email sent. Patient voiced no concerns for infant at this time. Deforest Hoyles, RN, 10/03/20, [phone]

## 2020-10-13 NOTE — Progress Notes (Signed)
Pt here today for wound check. Pt had C-section on 10/02/2020.  Pt denies any drainage, redness or fever to site.   Incision today is clean, dry, intact and well approximated. Pt had  Steri-strips that were removed today. Left incision site not well approximated and reinforced with new steri-strips. No drainage, redness noted at this time.  Pt tolerated well.   Pt advised to keep clean and dry. Pt verbalized understanding.   Pt has scheduled PP visit on 11/03/20. Pt requesting return to work note. Note approved to go back to work per Dr Alysia Penna. Letter given to pt.   Judeth Cornfield, RN

## 2020-10-13 NOTE — Progress Notes (Signed)
Agree with A & P. 

## 2020-10-24 ENCOUNTER — Other Ambulatory Visit: Payer: Self-pay

## 2020-10-24 ENCOUNTER — Inpatient Hospital Stay (HOSPITAL_COMMUNITY)
Admission: AD | Admit: 2020-10-24 | Discharge: 2020-10-24 | Disposition: A | Discharge status: Home / Self Care | Payer: Medicaid Other | Attending: Obstetrics & Gynecology | Admitting: Obstetrics & Gynecology

## 2020-10-24 ENCOUNTER — Encounter: Payer: Self-pay | Admitting: *Deleted

## 2020-10-24 DIAGNOSIS — O900 Disruption of cesarean delivery wound: Secondary | ICD-10-CM | POA: Diagnosis not present

## 2020-10-24 DIAGNOSIS — L7682 Other postprocedural complications of skin and subcutaneous tissue: Secondary | ICD-10-CM | POA: Insufficient documentation

## 2020-10-24 MED ORDER — CEFADROXIL 500 MG PO CAPS: 500.0000 mg | ORAL_CAPSULE | Freq: Two times a day (BID) | ORAL | 0 refills | Status: DC

## 2020-10-24 MED ORDER — CEFADROXIL 500 MG PO CAPS: 500.0000 mg | ORAL_CAPSULE | Freq: Two times a day (BID) | ORAL | 0 refills | Status: AC

## 2020-10-24 NOTE — MAU Note (Signed)
Provider examined incision and cleansed with Betadine and place loose dressing on it to help with reducing friction. Instruted pt to remove and  allow air to get to area as much as possible to aid with healing.

## 2020-10-24 NOTE — MAU Provider Note (Signed)
History     CSN: [id number]  Arrival date and time: 10/24/20 1255   Event Date/Time   First Provider Initiated Contact with Patient 10/24/20 1338      Chief Complaint  Patient presents with   Wound Check   HPI Claudia Lambert is a 35 y.o. [id number] post op patient who presents to MAU with complaint that her operative incision is opening. This is a new problem, onset today. Patient states the left edge of her incision is pulling apart. She has returned to work but works from home in a seated position. She also endorses new onset pain and tenderness along her incision. She denies itching, discharge, bruising or streaking. She denies fever.  Patient receives care with [facility].  OB History     Gravida  4   Para  2   Term  2   Preterm  0   AB  2   Living  2      SAB  1   IAB  1   Ectopic      Multiple  0   Live Births  2           Past Medical History:  Diagnosis Date   Asthma    Eczema    GDM (gestational diabetes mellitus) 07/16/2020   History of diet controlled gestational diabetes mellitus (GDM) 04/10/2020   Infection    UTI    Past Surgical History:  Procedure Laterality Date   CESAREAN SECTION     Placenta Previa   CESAREAN SECTION N/A 10/02/2020   Procedure: CESAREAN SECTION;  Surgeon: Lesly Dukes, MD;  Location: [facility];  Service: Obstetrics;  Laterality: N/A;    Family History  Problem Relation Age of Onset   Hypertension Mother    Fibroids Mother     Social History   Tobacco Use   Smoking status: Never   Smokeless tobacco: Never  Vaping Use   Vaping Use: Never used  Substance Use Topics   Alcohol use: Not Currently    Comment: occ, "not really a drinker"   Drug use: Not Currently    Types: Marijuana    Comment: last was Sept 2021    Allergies: No Known Allergies  Medications Prior to Admission  Medication Sig Dispense Refill Last Dose   ferrous sulfate 325 (65 FE) MG tablet Take 1 tablet (325 mg total) by mouth every other  day. 30 tablet 2    ibuprofen (ADVIL) 600 MG tablet Take 1 tablet (600 mg total) by mouth every 6 (six) hours. 30 tablet 1    prenatal vitamin w/FE, FA (NATACHEW) 29-1 MG CHEW chewable tablet Chew 1 tablet by mouth daily at 12 noon. 30 tablet 12     Review of Systems  Skin:        Incisional pain  All other systems reviewed and are negative. Physical Exam   Blood pressure 125/83, pulse 76, temperature 97.9 F (36.6 C), resp. rate 18, currently breastfeeding.  Physical Exam Vitals and nursing note reviewed. Exam conducted with a chaperone present.  Constitutional:      Appearance: Normal appearance.  Cardiovascular:     Rate and Rhythm: Normal rate.     Pulses: Normal pulses.  Pulmonary:     Effort: Pulmonary effort is normal.  Skin:    Comments: Cesarean incision is well approximated, clean, dry. Leftmost margin has small area of open skin with external irritation. TTP as appropriate for post-op timeline for healing. Small area of swelling.  No tunneling,induration.  Neurological:     Mental Status: She is alert and oriented to person, place, and time.  Psychiatric:        Mood and Affect: Mood normal.        Behavior: Behavior normal.        Thought Content: Thought content normal.        Judgment: Judgment normal.    MAU Course  Procedures  --Patient reassured that wound is healing well. New onset tenderness, small area of swelling at left margin of incision which is slightly more tender than remainder of incision. Discussed with Dr. Debroah Loop. Will initiate abx               Meds ordered this encounter  Medications   cefadroxil (DURICEF) 500 MG capsule    Sig: Take 1 capsule (500 mg total) by mouth 2 (two) times daily.    Dispense:  14 capsule    Refill:  0    Order Specific Question:   Supervising Provider    Answer:   Adam Phenix [[id number]]   Assessment and Plan  --35 y.o. [phone] s/p elective repeat cesarean 10/02/2020 --New onset tenderness and  superficial injury associated with friction --Wound care reviewed, incision cleaned with Betadine in MAU --Discussed wearing pants/shorts with waistband either at umbilicus or rolled down below incision --New rx Duricef --Discharge home in stable condition  F/U: Message sent to [facility] requesting incision check 07/28 or 07/29  Calvert Cantor, CNM 10/24/2020, 3:39 PM

## 2020-10-24 NOTE — MAU Note (Addendum)
PP 10/02/20 c-section. Feells like her scar is opening up. Stated she had it looked at last week and they put some steri strips on it and told her ot take them off on Monday but she took them off today and she stated it feels like it is open and like it is  a "cut"   On exam the left distal art of incision  superficially open  No redness, swelling no drainage noted.

## 2020-10-30 ENCOUNTER — Other Ambulatory Visit: Payer: Self-pay | Admitting: *Deleted

## 2020-10-30 DIAGNOSIS — O24429 Gestational diabetes mellitus in childbirth, unspecified control: Secondary | ICD-10-CM

## 2020-11-03 ENCOUNTER — Ambulatory Visit (INDEPENDENT_AMBULATORY_CARE_PROVIDER_SITE_OTHER): Payer: Medicaid Other | Admitting: Family Medicine

## 2020-11-03 ENCOUNTER — Encounter: Payer: Self-pay | Admitting: Family Medicine

## 2020-11-03 ENCOUNTER — Other Ambulatory Visit: Payer: Medicaid Other

## 2020-11-03 ENCOUNTER — Other Ambulatory Visit (HOSPITAL_COMMUNITY)
Admission: RE | Admit: 2020-11-03 | Discharge: 2020-11-03 | Disposition: A | Discharge status: Home / Self Care | Payer: Medicaid Other | Source: Ambulatory Visit | Attending: Family Medicine | Admitting: Family Medicine

## 2020-11-03 ENCOUNTER — Other Ambulatory Visit: Payer: Self-pay

## 2020-11-03 VITALS — BP 120/80 | HR 72 | Wt 172.0 lb

## 2020-11-03 DIAGNOSIS — Z124 Encounter for screening for malignant neoplasm of cervix: Secondary | ICD-10-CM

## 2020-11-03 DIAGNOSIS — Z8632 Personal history of gestational diabetes: Secondary | ICD-10-CM

## 2020-11-03 DIAGNOSIS — O24429 Gestational diabetes mellitus in childbirth, unspecified control: Secondary | ICD-10-CM

## 2020-11-03 DIAGNOSIS — Z98891 History of uterine scar from previous surgery: Secondary | ICD-10-CM | POA: Diagnosis not present

## 2020-11-03 NOTE — Progress Notes (Signed)
Post Partum Visit Note  Diva Erck is a 35 y.o. [phone] female who presents for a postpartum visit. She is 4 weeks postpartum following a repeat cesarean section.  I have fully reviewed the prenatal and intrapartum course. The delivery was at 39 gestational weeks.  Anesthesia: spinal. Postpartum course has been unremarkable. Baby is doing well. Baby is feeding by breast. Bleeding no bleeding. Bowel function is normal. Bladder function is normal. Patient is not sexually active. Contraception method is none. Postpartum depression screening: negative.   The pregnancy intention screening data noted above was reviewed. Potential methods of contraception were discussed. The patient elected to proceed with No data recorded.   Edinburgh Postnatal Depression Scale - 11/03/20 0845       Edinburgh Postnatal Depression Scale:  In the Past 7 Days   I have been able to laugh and see the funny side of things. 0    I have looked forward with enjoyment to things. 0    I have blamed myself unnecessarily when things went wrong. 0    I have been anxious or worried for no good reason. 0    I have felt scared or panicky for no good reason. 0    Things have been getting on top of me. 0    I have been so unhappy that I have had difficulty sleeping. 0    I have felt sad or miserable. 0    I have been so unhappy that I have been crying. 0    The thought of harming myself has occurred to me. 0    Edinburgh Postnatal Depression Scale Total 0             Health Maintenance Due  Topic Date Due   URINE MICROALBUMIN  Never done   PAP SMEAR-Modifier  Never done   INFLUENZA VACCINE  11/03/2020    The following portions of the patient's history were reviewed and updated as appropriate: allergies, current medications, past family history, past medical history, past social history, past surgical history, and problem list.  Review of Systems Pertinent items noted in HPI and remainder of comprehensive ROS  otherwise negative.  Objective:  BP 120/80   Pulse 72   Wt 172 lb (78 kg)   LMP  (LMP Unknown)   BMI 27.76 kg/m    General:  alert, cooperative, and appears stated age  Lungs: Normal effort  Heart:  regular rate and rhythm  Abdomen: soft, non-tender; bowel sounds normal; no masses,  no organomegaly   Wound wound edges not approximated treated with AgNO3  GU exam:  normal BUS normal, vagina is pink and rugated, cervix is parous without lesion       Assessment:    Normal postpartum exam.   Plan:   Essential components of care per ACOG recommendations:  1.  Mood and well being: Patient with negative depression screening today. Reviewed local resources for support.  - Patient tobacco use? No.   - hx of drug use? No.    2. Infant care and feeding:  -Patient currently breastmilk feeding? Yes. Reviewed importance of draining breast regularly to support lactation.  -Social determinants of health (SDOH) reviewed in EPIC. No concerns  3. Sexuality, contraception and birth spacing - Patient does not want a pregnancy in the next year.  Desired family size is 2 children.  - Reviewed forms of contraception in tiered fashion. Patient desired no method today.   - Discussed birth spacing of 18 months  4. Sleep and fatigue -Encouraged family/partner/community support of 4 hrs of uninterrupted sleep to help with mood and fatigue  5. Physical Recovery  - Discussed patients delivery and complications.  - Patient had a C-section repeat; no problems after deliver. Perineal healing reviewed. Patient expressed understanding - Patient has urinary incontinence? No. - Patient is safe to resume physical and sexual activity  6.  Health Maintenance - HM due items addressed Yes - Last pap smear No results found for: DIAGPAP Pap smear done at today's visit.  -Breast Cancer screening indicated? No.   7. Chronic Disease/Pregnancy Condition follow up: Gestational Diabetes 2 hour today  - PCP  follow up  Reva Bores, MD Center for [facility], [facility]

## 2020-11-04 LAB — GLUCOSE TOLERANCE, 2 HOURS
Glucose, 2 hour: 113 mg/dL (ref 65–139)
Glucose, GTT - Fasting: 98 mg/dL (ref 65–99)

## 2020-11-05 LAB — CYTOLOGY - PAP
Comment: NEGATIVE
Diagnosis: NEGATIVE
High risk HPV: NEGATIVE

## 2020-12-05 ENCOUNTER — Ambulatory Visit (INDEPENDENT_AMBULATORY_CARE_PROVIDER_SITE_OTHER): Payer: Medicaid Other | Admitting: Family Medicine

## 2020-12-05 ENCOUNTER — Encounter: Payer: Self-pay | Admitting: Family Medicine

## 2020-12-05 ENCOUNTER — Other Ambulatory Visit: Payer: Self-pay

## 2020-12-05 ENCOUNTER — Telehealth: Payer: Self-pay | Admitting: Lactation Services

## 2020-12-05 VITALS — BP 122/84 | HR 69 | Ht 66.0 in | Wt 172.0 lb

## 2020-12-05 DIAGNOSIS — L089 Local infection of the skin and subcutaneous tissue, unspecified: Secondary | ICD-10-CM

## 2020-12-05 DIAGNOSIS — T148XXA Other injury of unspecified body region, initial encounter: Secondary | ICD-10-CM

## 2020-12-05 MED ORDER — IBUPROFEN 600 MG PO TABS: 600.0000 mg | ORAL_TABLET | Freq: Four times a day (QID) | ORAL | 0 refills | Status: DC | PRN

## 2020-12-05 MED ORDER — ACETAMINOPHEN 325 MG PO TABS: 650.0000 mg | ORAL_TABLET | Freq: Four times a day (QID) | ORAL | 0 refills | Status: AC | PRN

## 2020-12-05 MED ORDER — SULFAMETHOXAZOLE-TRIMETHOPRIM 800-160 MG PO TABS: 1.0000 | ORAL_TABLET | Freq: Two times a day (BID) | ORAL | 0 refills | Status: AC

## 2020-12-05 NOTE — Assessment & Plan Note (Signed)
L side of wound with purulent drainage, unclear why this has occurred so remotely from her cesarean. Likely unrelated to that procedure, likely mild trauma to area that has developed into an infection due to scarring in that area. No drainable collected appreciated on exam or [place], recommended soaking TID and Bactrim BID for five days. Reviewed warning signs of fever, worsening pain or swelling, or worsening discharge. All questions answered.

## 2020-12-05 NOTE — Progress Notes (Signed)
GYNECOLOGY OFFICE VISIT NOTE  History:   Claudia Lambert is a 35 y.o. [id number] here today for wound check.  Patient s/p repeat LTCS on 10/02/2020, procedure was uncomplicated Seen again in MAU on 10/24/2020 for concerns about L side of cesarean wound, very mild separation of the skin but not deeper, started on Duricef and discharged home (pictures available from that encounter in chart)  Today reports for the past three days she has been having worsening pain and a burning sensation at the L corner of her skin incision Does not recall any trauma to the area, does not shave in that area Has also had some discharge from the area Denies fever or nausea vomiting Applied some coconut oil to the area for the burning which helped a little but did not alleviate it  Health Maintenance Due  Topic Date Due   URINE MICROALBUMIN  Never done   INFLUENZA VACCINE  Never done    Past Medical History:  Diagnosis Date   Asthma    Eczema    GDM (gestational diabetes mellitus) 07/16/2020   History of diet controlled gestational diabetes mellitus (GDM) 04/10/2020   Infection    UTI    Past Surgical History:  Procedure Laterality Date   CESAREAN SECTION     Placenta Previa   CESAREAN SECTION N/A 10/02/2020   Procedure: CESAREAN SECTION;  Surgeon: Lesly Dukes, MD;  Location: [facility] LD [facility]S;  Service: Obstetrics;  Laterality: N/A;    The following portions of the patient's history were reviewed and updated as appropriate: allergies, current medications, past family history, past medical history, past social history, past surgical history and problem list.   Health Maintenance:   Last pap: Lab Results  Component Value Date   DIAGPAP  11/03/2020    - Negative for intraepithelial lesion or malignancy (NILM)   HPVHIGH Negative 11/03/2020     Last mammogram:  N/a    Review of Systems:  Pertinent items noted in HPI and remainder of comprehensive ROS otherwise negative.  Physical Exam:  BP  122/84   Pulse 69   Ht 5\' 6"  (1.676 m)   Wt 172 lb (78 kg)   Breastfeeding Yes   BMI 27.76 kg/m  CONSTITUTIONAL: Well-developed, well-nourished female in no acute distress.  HEENT:  Normocephalic, atraumatic. External right and left ear normal. No scleral icterus.  NECK: Normal range of motion, supple, no masses noted on observation SKIN: L lateral aspect of incision with small area of drainage with purulent-bloody discharge. Surrounding area mildly edematous but not indurated, no significant fluctuation appreciated. Bedside performed with some cobblestoning but no frank collection identified MUSCULOSKELETAL: Normal range of motion. No edema noted. NEUROLOGIC: Alert and oriented to person, place, and time. Normal muscle tone coordination.  PSYCHIATRIC: Normal mood and affect. Normal behavior. Normal judgment and thought content. RESPIRATORY: Effort normal, no problems with respiration noted  Labs and Imaging No results found for this or any previous visit (from the past 168 hour(s)). No results found.    Assessment and Plan:   Problem List Items Addressed This Visit       Other   Wound infection - Primary    L side of wound with purulent drainage, unclear why this has occurred so remotely from her cesarean. Likely unrelated to that procedure, likely mild trauma to area that has developed into an infection due to scarring in that area. No drainable collected appreciated on exam or [place], recommended soaking TID and Bactrim BID for five  days. Reviewed warning signs of fever, worsening pain or swelling, or worsening discharge. All questions answered.       Relevant Medications   sulfamethoxazole-trimethoprim (BACTRIM DS) 800-160 MG tablet   acetaminophen (TYLENOL) 325 MG tablet   ibuprofen (ADVIL) 600 MG tablet    Routine preventative health maintenance measures emphasized. Please refer to After Visit Summary for other counseling recommendations.   Return if symptoms worsen or fail  to improve.    Total face-to-face time with patient: 15 minutes.  Over 50% of encounter was spent on counseling and coordination of care.   Venora Maples, MD/MPH Attending Family Medicine Physician, [facility] for [facility], [facility]

## 2020-12-05 NOTE — Telephone Encounter (Signed)
After speaking with Dr. Crissie Reese and chart review, it is recommended that patient be seen today.   Called and spoke with Patient and informed her there is an open appointment today at 10:15 and would recommend she be seen. Patient agreed to appointment and requested a letter be sent to her via My Chart to inform employer that she has an appointment this morning. Letter created and sent via My Chart.

## 2021-01-09 ENCOUNTER — Encounter: Payer: Self-pay | Admitting: Radiology

## 2021-01-23 ENCOUNTER — Ambulatory Visit: Payer: Medicaid Other | Attending: Physician Assistant | Admitting: Physical Therapy

## 2021-01-23 ENCOUNTER — Encounter: Payer: Self-pay | Admitting: Physical Therapy

## 2021-01-23 ENCOUNTER — Other Ambulatory Visit: Payer: Self-pay

## 2021-01-23 DIAGNOSIS — M6281 Muscle weakness (generalized): Secondary | ICD-10-CM | POA: Diagnosis present

## 2021-01-23 DIAGNOSIS — R252 Cramp and spasm: Secondary | ICD-10-CM | POA: Diagnosis not present

## 2021-01-23 DIAGNOSIS — R279 Unspecified lack of coordination: Secondary | ICD-10-CM | POA: Diagnosis present

## 2021-01-23 NOTE — Patient Instructions (Signed)
[email or web address]     Lubrication Used for intercourse to reduce friction Avoid ones that have glycerin, warming gels, tingling gels, icing or cooling gel, scented Avoid parabens due to a preservative similar to female sex hormone May need to be reapplied once or several times during sexual activity Can be applied to both partners genitals prior to vaginal penetration to minimize friction or irritation Prevent irritation and mucosal tears that cause post coital pain and increased the risk of vaginal and urinary tract infections Oil-based lubricants cannot be used with condoms due to breaking them down.  Least likely to irritate vaginal tissue.  Plant based-lubes are safe Silicone-based lubrication are thicker and last long and used for post-menopausal women  Vaginal Lubricators Here is a list of some suggested lubricators you can use for intercourse. Use the most hypoallergenic product.  You can place on you or your partner.  Slippery Stuff ( water based) Sylk or Sliquid Natural H2O ( good  if frequent UTI's)- walmart, amazon Sliquid organics silk-(aloe and silicone based ) [employer] (www.blossom-organics.com)- (aloe based ) Coconut oil, olive oil -not good with condoms  PJur Woman Nude- (water based) amazon Uberlube- ( silicon) Amazon Aloe Vera- Sprouts has an organic one Yes lubricant- (water based and has plant oil based similar to silicone) Loews [employer] Platinum-Silicone, Target, [facility] Olive and Bee intimate cream-  www.oliveandbee.com.au Pink - Loews Corporation stuff Erosense Sync- walmart, amazon Coconu- EverydayCosmetics.no  Things to avoid in lubricants are glycerin, warming gels, tingling gels, icing or cooling  gels, and scented gels.  Also avoid Vaseline. KY jelly, Replens, and Astroglide contain chlorhexidine which kills good bacteria(lactobacilli)  Things to avoid in the vaginal area Do not use things to irritate the vulvar area No lotions- see  below Soaps you  can use :Aveeno, Calendula, Good Clean Love cleanser if needed. Must be gentle No deodorants No douches Good to sleep without underwear to let the vaginal area to air out No scrubbing: spread the lips to let warm water rinse over labias and pat dry  Creams that can be used on the Vulva Area V [employer], walmart Vital V Wild Yam Salve Julva- [employer] Botanical Pro-Meno Wild Yam Cream Coconut oil, olive oil Cleo by [employer] labial moisturizer -Amazon,  Desert Burden Releveum ( lidocaine) or Desert Fluor Corporation Yes Moisturizer     Moisturizers They are used in the vagina to hydrate the mucous membrane that make up the vaginal canal. Designed to keep a more normal acid balance (ph) Once placed in the vagina, it will last between two to three days.  Use 2-3 times per week at bedtime  Ingredients to avoid is glycerin and fragrance, can increase chance of infection Should not be used just before sex due to causing irritation Most are gels administered either in a tampon-shaped applicator or as a vaginal suppository. They are non-hormonal.   Types of Moisturizers(internal use)  Vitamin E vaginal suppositories- Whole foods, Amazon Moist Again Coconut oil- can break down condoms Julva- (Do no use if on Tamoxifen) amazon Yes moisturizer- amazon NeuEve Silk , NeuEve Silver for menopausal or over 65 (if have severe vaginal atrophy or cancer treatments use NeuEve Silk for  1 month than move to [employer])- [employer], [place] Olive and Bee intimate cream- www.oliveandbee.com.au Mae vaginal moisturizer- Amazon Aloe    Creams to use externally on the Vulva area Marathon Oil (good for for cancer patients that had radiation to the area)- [place] or [employer].https://garcia-valdez.org/ V-magic cream - amazon Julva-amazon Vital "  V Wild Yam salve ( help moisturize and help with thinning vulvar area, does have Beeswax MoodMaid Botanical Pro-Meno Wild Yam Cream-  Amazon Desert Harvest Gele Cleo by Zane Herald labial moisturizer (Amazon,  Coconut or olive oil aloe   Things to avoid in the vaginal area Do not use things to irritate the vulvar area No lotions just specialized creams for the vulva area- Neogyn, V-magic, No soaps; can use Aveeno or Calendula cleanser if needed. Must be gentle No deodorants No douches Good to sleep without underwear to let the vaginal area to air out No scrubbing: spread the lips to let warm water rinse over labias and pat dry

## 2021-01-23 NOTE — Therapy (Signed)
[facility] @ [facility] [address] [place], [place], [address] Phone: [phone]   Fax:  [phone]  Physical Therapy Evaluation  Patient Details  Name: Claudia Lambert MRN: [id number] Date of Birth: [birth year] Referring Provider (PT): Remus Loffler, PA-C   Encounter Date: 01/23/2021   PT End of Session - 01/23/21 1253     Visit Number 1    Date for PT Re-Evaluation 05/26/21    Authorization Type healthy blue    PT Start Time 0930    PT Stop Time 1015    PT Time Calculation (min) 45 min    Activity Tolerance Patient tolerated treatment well    Behavior During Therapy [facility] for tasks assessed/performed             Past Medical History:  Diagnosis Date   Asthma    Eczema    GDM (gestational diabetes mellitus) 07/16/2020   History of diet controlled gestational diabetes mellitus (GDM) 04/10/2020   Infection    UTI    Past Surgical History:  Procedure Laterality Date   CESAREAN SECTION     Placenta Previa   CESAREAN SECTION N/A 10/02/2020   Procedure: CESAREAN SECTION;  Surgeon: Lesly Dukes, MD;  Location: [facility] LD ORS;  Service: Obstetrics;  Laterality: N/A;    There were no vitals filed for this visit.    Subjective Assessment - 01/23/21 0934     Subjective Pt reports pelvic pain starting during pregnancy at pubic bone and bil hips, has continued post partum and intermittently has back pain to below shoulder blades. Pt had c-section and has been trying to do stretching but nothing has helped so far.pt is still nursing. Pt did have prior c-section as well with first child and did have sciatica with first.    Pertinent History c-secton 10/05/20, had Lt side of c-section wound seperation.    Limitations Sitting;Walking;Lifting;House hold activities;Standing    How long can you sit comfortably? depends sometimes 2-3 hours    How long can you stand comfortably? back pain will start intermittently but not as limited in  standing    How long can you walk comfortably? no limitations with walking, initial steps from bed will have pain but in hips/tops of pelvic bone but relieves    Patient Stated Goals to have less pain    Currently in Pain? Yes    Pain Score 6    worst pain 7/10 with starting to take step from getting out of bed at pelvis but this relieves   Pain Location Back    Pain Orientation Mid   under shoulder blades   Pain Descriptors / Indicators Constant;Nagging;Sore    Pain Type Chronic pain    Pain Onset More than a month ago    Pain Frequency Constant    Aggravating Factors  sitting, initial steps, lifting children    Pain Relieving Factors pressing down at front of pelvis helps                [facility] PT Assessment - 01/23/21 0001       Assessment   Medical Diagnosis R10.2 (ICD-10-CM) - Pelvic and perineal pain    Referring Provider (PT) Remus Loffler, PA-C    Onset Date/Surgical Date --   months ago while pregnant   Prior Therapy not for PFPT      Precautions   Precautions Other (comment)    Precaution Comments postpartum      Restrictions  Weight Bearing Restrictions No      Balance Screen   Has the patient fallen in the past 6 months No    Has the patient had a decrease in activity level because of a fear of falling?  No    Is the patient reluctant to leave their home because of a fear of falling?  No      Home Environment   Living Environment Private residence    Living Arrangements Spouse/significant other;Children    Available Help at Discharge Family      Prior Function   Level of Independence Independent    Vocation Full time employment    Vocation Requirements work from home for [employer]   Overall Cognitive Status Within Functional Limits for tasks assessed      Sensation   Light Touch Appears Intact      Coordination   Gross Motor Movements are Fluid and Coordinated Yes    Fine Motor Movements are Fluid and Coordinated Yes       Posture/Postural Control   Posture/Postural Control Postural limitations    Postural Limitations Rounded Shoulders;Posterior pelvic tilt;Increased thoracic kyphosis      ROM / Strength   AROM / PROM / Strength AROM;Strength      AROM   Overall AROM Comments thoracic and lumbar limited in all directions by 25% except rotation 50%      Strength   Overall Strength Comments bil hips grossly 3+/5 limited with pain as well in all directions at mid low back and pubic symphasis      Flexibility   Soft Tissue Assessment /Muscle Length yes      Palpation   Spinal mobility decreased rib mobility    SI assessment  pain in B SIJ more so in flexion but also at neutral standing, pain at pubic bone and symphasis and mid and mid Lt lower quadrant of abdomen                     No emotional/communication barriers or cognitive limitation. Patient is motivated to learn. Patient understands and agrees with treatment goals and plan. PT explains patient will be examined in standing, sitting, and lying down to see how their muscles and joints work. When they are ready, they will be asked to remove their underwear so PT can examine their perineum. The patient is also given the option of providing their own chaperone as one is not provided in our facility. The patient also has the right and is explained the right to defer or refuse any part of the evaluation or treatment including the internal exam. With the patient's consent, PT will use one gloved finger to gently assess the muscles of the pelvic floor, seeing how well it contracts and relaxes and if there is muscle symmetry. After, the patient will get dressed and PT and patient will discuss exam findings and plan of care. PT and patient discuss plan of care, schedule, attendance policy and HEP activities.     Objective measurements completed on examination: See above findings.     Pelvic Floor Special Questions - 01/23/21 0001     Prior  Pelvic/Prostate Exam Yes   WFL   Are you Pregnant or attempting pregnancy? No    Prior Pregnancies Yes    Number of Pregnancies 4    Number of C-Sections 2    Currently Sexually Active Yes    Is this Painful Yes   with penetration,  denied dryness or irritation   History of sexually transmitted disease No    Marinoff Scale pain prevents any attempts at intercourse   but doesn't prevent orgasm   Urinary Leakage No    Urinary urgency No    Urinary frequency no    Fecal incontinence No    Fluid intake drinks a lot of water per pt multiple bottles    Caffeine beverages 1-cup coffee per day    Falling out feeling (prolapse) No    External Perineal Exam WFL    Prolapse None    Pelvic Floor Internal Exam patient identified and patient confirms consent for PT to perform internal soft tissue work and muscle strength and integrity assessment    Exam Type Vaginal    Sensation WFL    Palpation TTP throughout pelvis though Lt>Rt    Strength good squeeze, good lift, able to hold agaisnt strong resistance   but unable to fully relax, unable to bulge   Strength # of reps 6   able to contract 11 times however noted decrease in strength after 6   Strength # of seconds 7    Tone increased                       PT Education - 01/23/21 1253     Education Details Pt educated on exam findings, POC, HEP    Person(s) Educated Patient    Methods Explanation;Demonstration;Verbal cues;Tactile cues;Handout    Comprehension Verbalized understanding;Returned demonstration              PT Short Term Goals - 01/23/21 1259       PT SHORT TERM GOAL #1   Title independent with initial HEP    Time 6    Period Weeks    Status New    Target Date 05/26/21      PT SHORT TERM GOAL #2   Title pt to report no more than 5/10 pain at pelvis and back with activity to improve QOL    Time 6    Period Weeks    Status New    Target Date 03/06/21      PT SHORT TERM GOAL #3   Title pt to  demonstrate improved coordination of pelvic floor and breathing mechanics to fully relax pelvis for decreased pain.    Time 6    Period Weeks    Status New    Target Date 03/06/21               PT Long Term Goals - 01/23/21 1300       PT LONG TERM GOAL #1   Title pt to be I with advanced HEP    Time 4    Period Months    Status New    Target Date 05/26/21      PT LONG TERM GOAL #2   Title pt to demonstrate at least 5/5 pelvic floor strength with ability to fully relax between contractions with minimal cues for decreased pain.    Baseline .Marland Kitchen    Time 4    Period Months    Status New    Target Date 05/26/21      PT LONG TERM GOAL #3   Title pt to report no more than 2/10 pain at pelvis and back with activity to improve QOL    Baseline .Marland Kitchen    Time 4    Period Months    Status New    Target  Date 05/26/21      PT LONG TERM GOAL #4   Title pt to demonstrate improved coordination of pelvic floor and breathing mechanics to fully relax pelvis for decreased pain at least 75% of the time.    Baseline .Marland Kitchen    Time 4    Period Months    Status New    Target Date 05/26/21      PT LONG TERM GOAL #5   Title pt to demonstrate at least 5/5 bil hip strength for improved pelvic stability and decreased pain.    Time 4    Period Months    Status New    Target Date 05/26/21                    Plan - 01/23/21 1254     Clinical Impression Statement Pt is 35yo female presenting with pelvic and back pain starting in pregnancy and has continued postpartum, c-section 10/05/20. Pt also has pain with penetration during intercourse. Pt found to have weakness in bil hips, decreased mobility in spine and hips, decreased mobility at c-section scar and rib mobility, chest breathing pattern noted. Pt consented to internal pelvic assessment vaginally and found to have weakness, decreased coordination and endurance as well and pain throughout pelvis more so on Lt side but bilaterally, pain at  low back with palpation, pain in lower abdomen and pubic bone with palpation and bil adductors. Pt also has increased tone throughout pelvis and would benefit from continued PT to address these deficits.    Personal Factors and Comorbidities Comorbidity 1;Time since onset of injury/illness/exacerbation    Comorbidities postpartum    Examination-Activity Limitations Squat;Sit;Caring for Others   intercourse   Examination-Participation Restrictions Interpersonal Relationship;Yard Work;Shop;Community Activity    Stability/Clinical Decision Making Stable/Uncomplicated    Clinical Decision Making Moderate    Rehab Potential Good    PT Frequency 1x / week    PT Duration 12 weeks    PT Treatment/Interventions ADLs/Self Care Home Management;Aquatic Therapy;Functional mobility training;Therapeutic activities;Therapeutic exercise;Neuromuscular re-education;Manual techniques;Taping;Scar mobilization;Passive range of motion;Energy conservation    PT Next Visit Plan go over all handouts, stretching and relaxation techniques    PT Home Exercise Plan [id number]    Consulted and Agree with Plan of Care Patient             Patient will benefit from skilled therapeutic intervention in order to improve the following deficits and impairments:  Decreased endurance, Decreased coordination, Increased fascial restricitons, Impaired tone, Pain, Increased muscle spasms, Improper body mechanics, Impaired flexibility, Decreased scar mobility, Decreased mobility, Decreased strength, Postural dysfunction  Visit Diagnosis: Cramp and spasm - Plan: PT plan of care cert/re-cert  Muscle weakness (generalized) - Plan: PT plan of care cert/re-cert  Lack of coordination - Plan: PT plan of care cert/re-cert     Problem List Patient Active Problem List   Diagnosis Date Noted   Wound infection 12/05/2020   Abnormal echocardiogram 06/12/2020   History of cesarean delivery 06/11/2020   History of diet controlled  gestational diabetes mellitus (GDM) 04/10/2020   Asthma 04/10/2020   Marijuana use 03/04/2020   Otelia Sergeant, PT, DPT 10/21/221:05 PM   [facility] @ [facility] [address] [place], [place], [address] Phone: [phone]   Fax:  [phone]  Name: Claudia Lambert MRN: [id number] Date of Birth: March 01, 1986

## 2021-02-12 ENCOUNTER — Ambulatory Visit: Payer: Medicaid Other | Attending: Physician Assistant | Admitting: Physical Therapy

## 2021-02-12 ENCOUNTER — Other Ambulatory Visit: Payer: Self-pay

## 2021-02-12 DIAGNOSIS — R279 Unspecified lack of coordination: Secondary | ICD-10-CM | POA: Insufficient documentation

## 2021-02-12 DIAGNOSIS — R252 Cramp and spasm: Secondary | ICD-10-CM | POA: Insufficient documentation

## 2021-02-12 DIAGNOSIS — M6281 Muscle weakness (generalized): Secondary | ICD-10-CM | POA: Insufficient documentation

## 2021-02-12 NOTE — Therapy (Addendum)
[facility] [place], [place], [address] Phone: [phone]   Fax:  [phone]  Physical Therapy Treatment  Patient Details  Name: Glendell Schlottman MRN: [id number] Date of Birth: 21-Feb-1986 Referring Provider (PT): Terald Sleeper, PA-C   Encounter Date: 02/12/2021   PT End of Session - 02/12/21 0833     Visit Number 2    Date for PT Re-Evaluation 05/26/21    Authorization Type healthy blue    Authorization - Number of Visits 12   until 04/20/21   PT Start Time 0806    PT Stop Time 0845    PT Time Calculation (min) 39 min    Activity Tolerance Patient tolerated treatment well    Behavior During Therapy [facility] for tasks assessed/performed             Past Medical History:  Diagnosis Date   Asthma    Eczema    GDM (gestational diabetes mellitus) 07/16/2020   History of diet controlled gestational diabetes mellitus (GDM) 04/10/2020   Infection    UTI    Past Surgical History:  Procedure Laterality Date   CESAREAN SECTION     Placenta Previa   CESAREAN SECTION N/A 10/02/2020   Procedure: CESAREAN SECTION;  Surgeon: Guss Bunde, MD;  Location: [facility] LD [facility];  Service: Obstetrics;  Laterality: N/A;    There were no vitals filed for this visit.   Subjective Assessment - 02/12/21 0807     Subjective Pt reports pain overall improving at hips and pubic bone and does still have some back back as well. Pt reports she still has pain with intercourse and feels it's mostly at Rt side.    Pertinent History c-secton 10/05/20, had Lt side of c-section wound seperation.    Limitations Sitting;Walking;Lifting;House hold activities;Standing    How long can you sit comfortably? depends sometimes 2-3 hours    How long can you stand comfortably? back pain will start intermittently but not as limited in standing    How long can you walk comfortably? no limitations with walking, initial steps from bed will have pain but in  hips/tops of pelvic bone but relieves    Patient Stated Goals to have less pain    Currently in Pain? Yes    Pain Score 6     Pain Location Back    Pain Orientation Mid    Pain Descriptors / Indicators Constant;Nagging    Pain Type Chronic pain                               [facility] Adult PT Treatment/Exercise - 02/12/21 0001       Exercises   Exercises Lumbar;Knee/Hip      Lumbar Exercises: Stretches   Other Lumbar Stretch Exercise cobra pose x45s    Other Lumbar Stretch Exercise sidelying thoracic opening x45s; x5 angle wing stretches in sitting.      Lumbar Exercises: Standing   Other Standing Lumbar Exercises half kneel resisted row red band x10 each; mini lunges x10    Other Standing Lumbar Exercises palloff red band x10; rotation palloff red band x10                     PT Education - 02/12/21 0833     Education Details Pt educated on proper techniques and breathing mechanics with all exercises    Person(s) Educated Patient  Methods Explanation;Demonstration;Tactile cues;Verbal cues    Comprehension Verbalized understanding;Returned demonstration              PT Short Term Goals - 01/23/21 1259       PT SHORT TERM GOAL #1   Title independent with initial HEP    Time 6    Period Weeks    Status New    Target Date 05/26/21      PT SHORT TERM GOAL #2   Title pt to report no more than 5/10 pain at pelvis and back with activity to improve QOL    Time 6    Period Weeks    Status New    Target Date 03/06/21      PT SHORT TERM GOAL #3   Title pt to demonstrate improved coordination of pelvic floor and breathing mechanics to fully relax pelvis for decreased pain.    Time 6    Period Weeks    Status New    Target Date 03/06/21               PT Long Term Goals - 01/23/21 1300       PT LONG TERM GOAL #1   Title pt to be I with advanced HEP    Time 4    Period Months    Status New    Target Date 05/26/21      PT  LONG TERM GOAL #2   Title pt to demonstrate at least 5/5 pelvic floor strength with ability to fully relax between contractions with minimal cues for decreased pain.    Baseline .Marland Kitchen    Time 4    Period Months    Status New    Target Date 05/26/21      PT LONG TERM GOAL #3   Title pt to report no more than 2/10 pain at pelvis and back with activity to improve QOL    Baseline .Marland Kitchen    Time 4    Period Months    Status New    Target Date 05/26/21      PT LONG TERM GOAL #4   Title pt to demonstrate improved coordination of pelvic floor and breathing mechanics to fully relax pelvis for decreased pain at least 75% of the time.    Baseline .Marland Kitchen    Time 4    Period Months    Status New    Target Date 05/26/21      PT LONG TERM GOAL #5   Title pt to demonstrate at least 5/5 bil hip strength for improved pelvic stability and decreased pain.    Time 4    Period Months    Status New    Target Date 05/26/21                   Plan - 02/12/21 0835     Clinical Impression Statement Pt presents to clinic reporting improving pain overall and compiant with HEP which is helping. Pt session emphasized core and hip strengthening and stretching and pt tolerated well. Pt benefited from cues for technique and breathing mechanics for all exercises during session to improve core activation and strengthening. Pt also has increased tone throughout pelvis and would benefit from continued PT to address these deficits.    Personal Factors and Comorbidities Comorbidity 1;Time since onset of injury/illness/exacerbation    Comorbidities postpartum    Examination-Activity Limitations Squat;Sit;Caring for Others   intercourse   Examination-Participation Restrictions Interpersonal Relationship;Yard Work;Shop;Community Activity  Stability/Clinical Decision Making Stable/Uncomplicated    Rehab Potential Good    PT Frequency 1x / week    PT Duration 12 weeks    PT Treatment/Interventions ADLs/Self Care Home  Management;Aquatic Therapy;Functional mobility training;Therapeutic activities;Therapeutic exercise;Neuromuscular re-education;Manual techniques;Taping;Scar mobilization;Passive range of motion;Energy conservation    PT Next Visit Plan c-section mobs; internal next session    PT Home Exercise Plan [id number]    Consulted and Agree with Plan of Care Patient             Patient will benefit from skilled therapeutic intervention in order to improve the following deficits and impairments:  Decreased endurance, Decreased coordination, Increased fascial restricitons, Impaired tone, Pain, Increased muscle spasms, Improper body mechanics, Impaired flexibility, Decreased scar mobility, Decreased mobility, Decreased strength, Postural dysfunction  Visit Diagnosis: Cramp and spasm  Muscle weakness (generalized)  Lack of coordination     Problem List Patient Active Problem List   Diagnosis Date Noted   Wound infection 12/05/2020   Abnormal echocardiogram 06/12/2020   History of cesarean delivery 06/11/2020   History of diet controlled gestational diabetes mellitus (GDM) 04/10/2020   Asthma 04/10/2020   Marijuana use 03/04/2020   Stacy Gardner, PT, DPT 11/10/228:47 AM   PHYSICAL THERAPY DISCHARGE SUMMARY  Visits from Start of Care: 2  Current functional level related to goals / functional outcomes: Unable to formally reassess pt has not returned since last treatment   Remaining deficits: Unable to formally reassess pt has not returned since last treatment   Education / Equipment: HEP   Patient agrees to discharge. Patient goals were partially met. Patient is being discharged due to not returning since the last visit. Thank you for the referral.   [facility] @ [address] [place], [place], [address] Phone: [phone]   Fax:  [phone]  Name: Amarilys Lyles MRN: [id number] Date of Birth: March 17, 1986

## 2021-02-20 ENCOUNTER — Encounter: Payer: Medicaid Other | Admitting: Physical Therapy

## 2021-02-25 ENCOUNTER — Encounter: Payer: Self-pay | Admitting: Advanced Practice Midwife

## 2021-03-11 ENCOUNTER — Ambulatory Visit: Payer: Medicaid Other | Attending: Physician Assistant | Admitting: Physical Therapy

## 2021-03-11 DIAGNOSIS — R252 Cramp and spasm: Secondary | ICD-10-CM | POA: Insufficient documentation

## 2021-03-11 DIAGNOSIS — M6281 Muscle weakness (generalized): Secondary | ICD-10-CM | POA: Insufficient documentation

## 2021-03-11 DIAGNOSIS — R279 Unspecified lack of coordination: Secondary | ICD-10-CM | POA: Insufficient documentation

## 2021-03-19 ENCOUNTER — Ambulatory Visit: Payer: Medicaid Other | Admitting: Physical Therapy

## 2021-03-19 ENCOUNTER — Telehealth: Payer: Self-pay | Admitting: Physical Therapy

## 2021-03-19 NOTE — Telephone Encounter (Signed)
PT called pt about this morning's appointment at 1530. Pt did not answer, no voicemail option.   Otelia Sergeant, PT, DPT 12/15/223:59 PM

## 2021-03-26 ENCOUNTER — Telehealth: Payer: Self-pay | Admitting: Physical Therapy

## 2021-03-26 ENCOUNTER — Ambulatory Visit: Payer: Medicaid Other | Admitting: Physical Therapy

## 2021-03-26 NOTE — Telephone Encounter (Signed)
PT called pt about this afternoon's appointment at 1530. Spoke to pt and updated her on attendance policy and pt's DC from PT. Pt understood and agreed.   Otelia Sergeant, PT, DPT 12/22/223:54 PM

## 2021-04-09 ENCOUNTER — Encounter: Payer: Medicaid Other | Admitting: Physical Therapy

## 2021-04-16 ENCOUNTER — Encounter: Payer: Medicaid Other | Admitting: Physical Therapy

## 2021-04-23 ENCOUNTER — Encounter: Payer: Medicaid Other | Admitting: Physical Therapy

## 2021-04-30 ENCOUNTER — Encounter: Payer: Medicaid Other | Admitting: Physical Therapy

## 2021-05-07 ENCOUNTER — Encounter: Payer: Medicaid Other | Admitting: Physical Therapy

## 2021-05-14 ENCOUNTER — Encounter: Payer: Medicaid Other | Admitting: Physical Therapy

## 2021-05-21 ENCOUNTER — Encounter: Payer: Medicaid Other | Admitting: Physical Therapy

## 2021-05-28 ENCOUNTER — Telehealth: Payer: Medicaid Other | Admitting: Nurse Practitioner

## 2021-05-28 DIAGNOSIS — N898 Other specified noninflammatory disorders of vagina: Secondary | ICD-10-CM

## 2021-05-28 NOTE — Progress Notes (Signed)
Based on what you shared with me it looks like you have vaginal discharge,that should be evaluated in a face to face office visit. Based on your questionnaire I cannot make a definitive diagnosis.  NOTE: There will be NO CHARGE for this eVisit   If you are having a true medical emergency please call 911.      For an urgent face to face visit, [facility] has six urgent care centers for your convenience:     [facility] [place] [facility] Directions [phone] [address] [place], [place] [address]    [facility]) Get Driving Directions [phone] [address] [place], [place] [address]  [facility]gent [facility] Center [facility]) Get Driving Directions [phone] [address] [place],  [place]  [address]  [facility] Health Urgent Care at [facility] Get Driving Directions [phone] [address] [place], [place] [address]   [facility] at [facility] Get Driving Directions  [phone] [address] [place], [place] [address]   [facility]gent [facility] at [facility] Directions [phone] [address], [address] [place], [place] [address]  Your MyChart E-visit questionnaire answers were reviewed by a board certified advanced clinical practitioner to complete your personal care plan based on your specific symptoms.  Thank you for using e-Visits.

## 2021-08-16 IMAGING — US US OB COMP LESS 14 WK
1 series · 15 of 28 positions shown · non-contrast
Comparison: None.

CLINICAL DATA: Cramping, spotting since this morning

EXAM:
OBSTETRIC <14 WK ULTRASOUND
TECHNIQUE: Transabdominal ultrasound was performed for evaluation of the
gestation as well as the maternal uterus and adnexal regions.

[Series 1: us ob comp less 14 wk · 29 acquisitions, 15 frames shown]
[im 1/29]
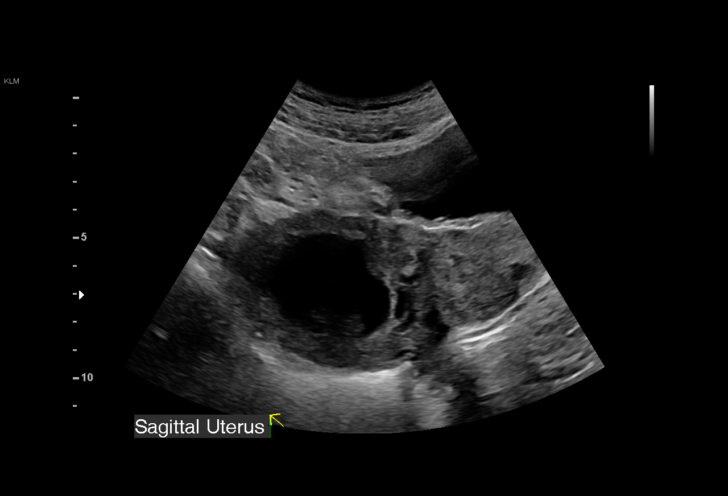
[im 3/29]
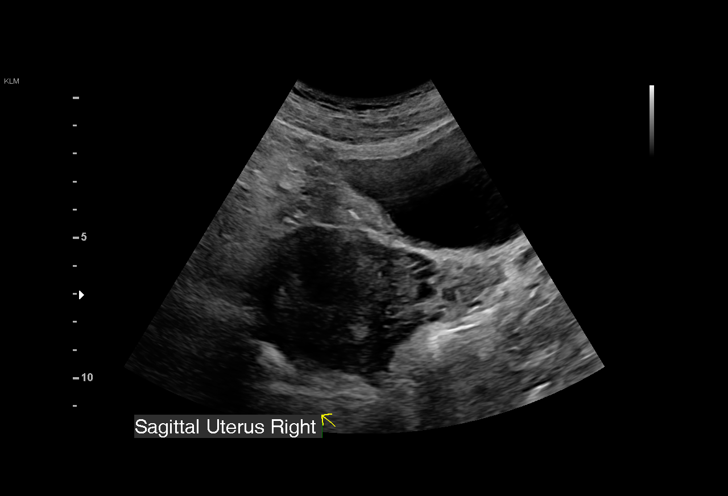
[im 5/29]
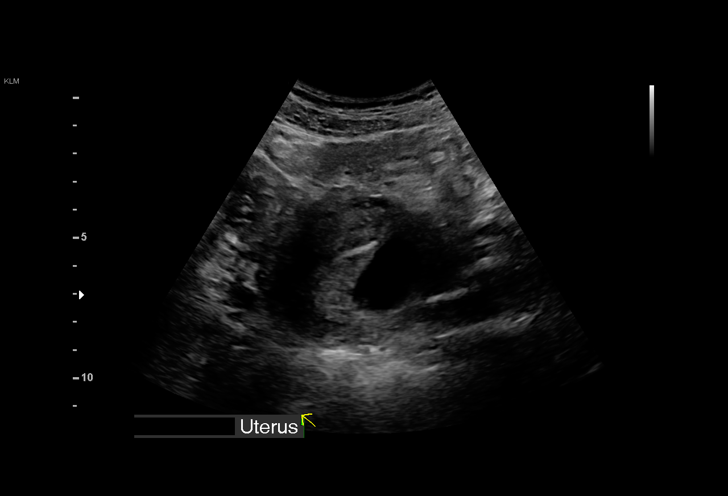
[im 7/29]
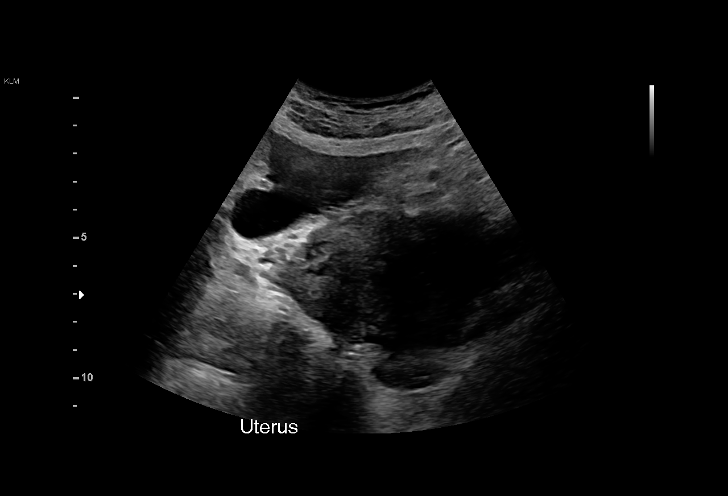
[im 9/29]
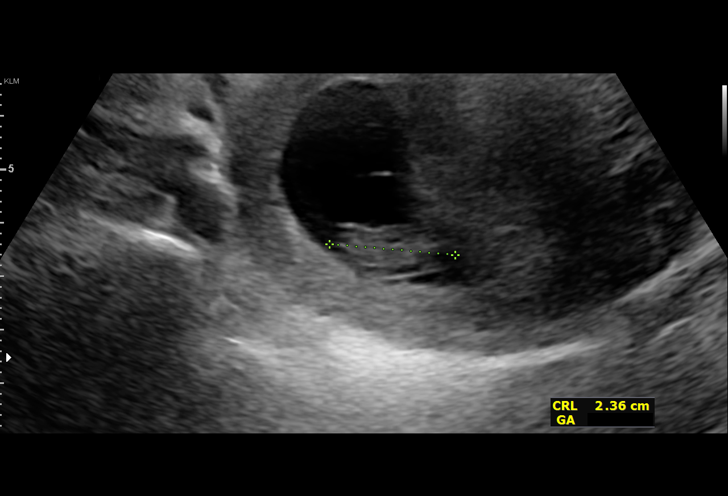
[im 11/29]
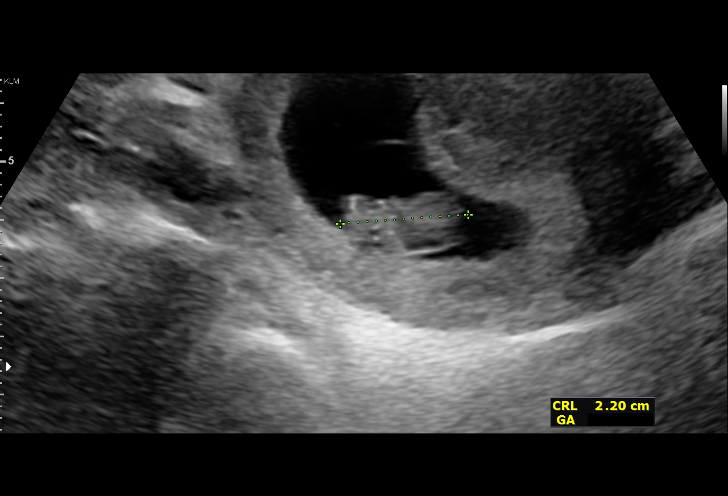
[im 13/29]
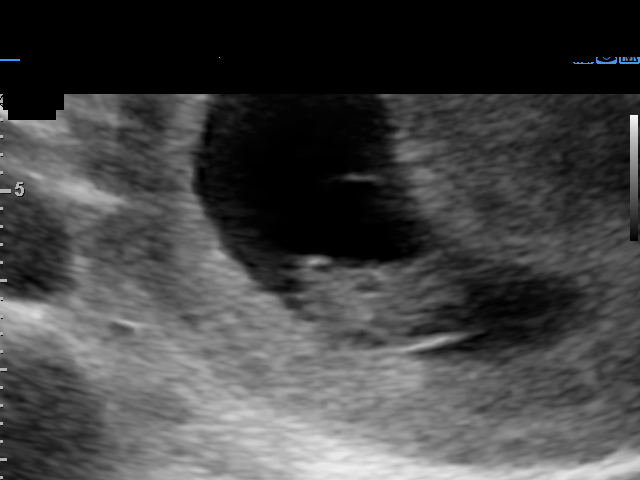
[im 15/29]
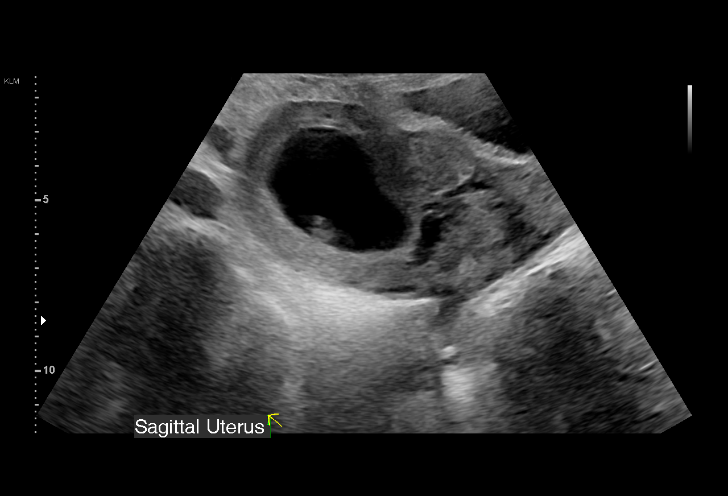
[im 16/29]
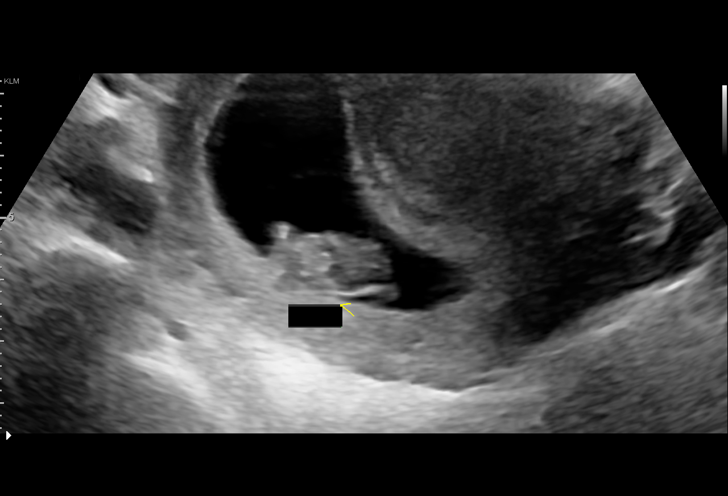
[im 18/29]
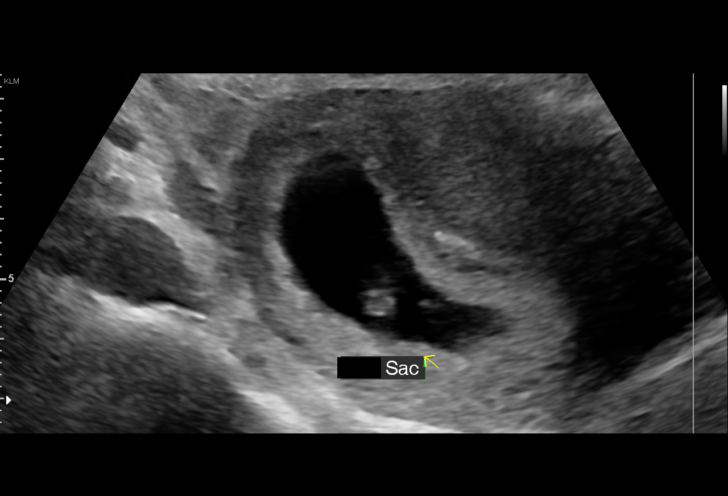
[im 20/29]
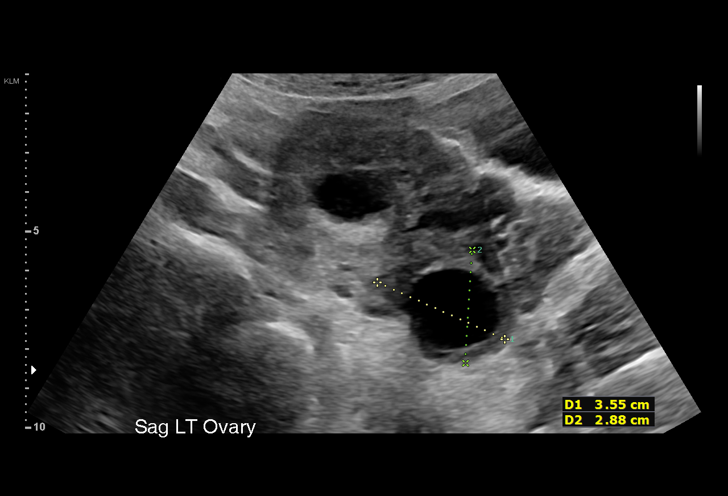
[im 22/29]
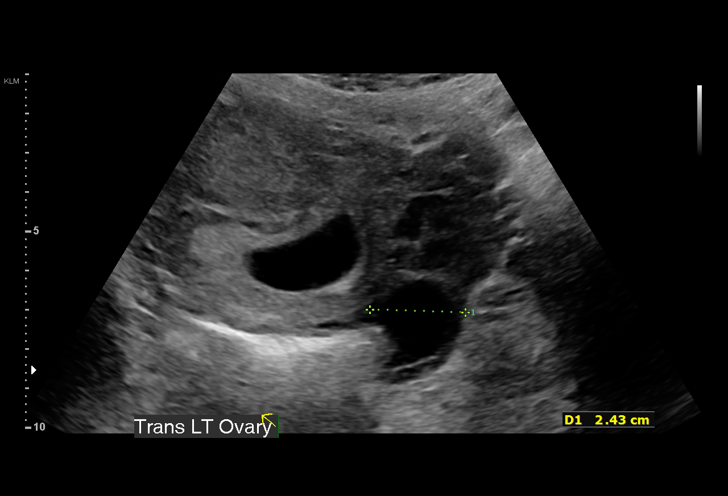
[im 24/29]
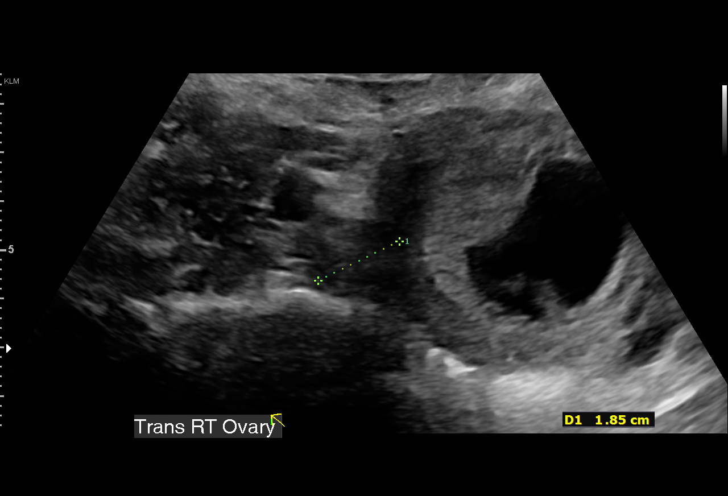
[im 26/29]
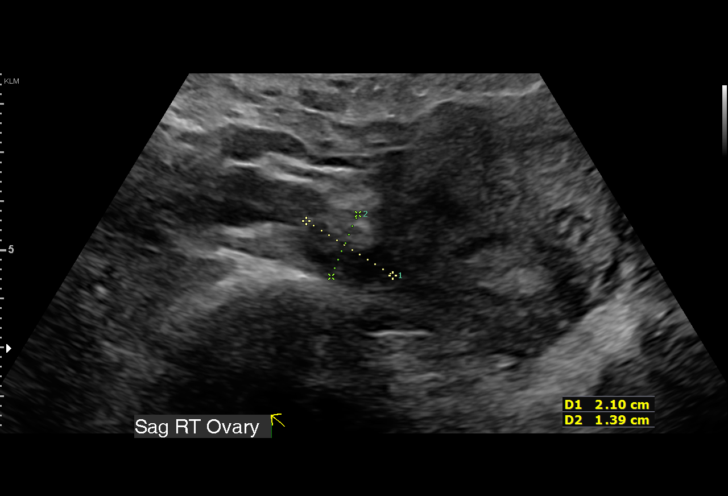
[im 29/29]
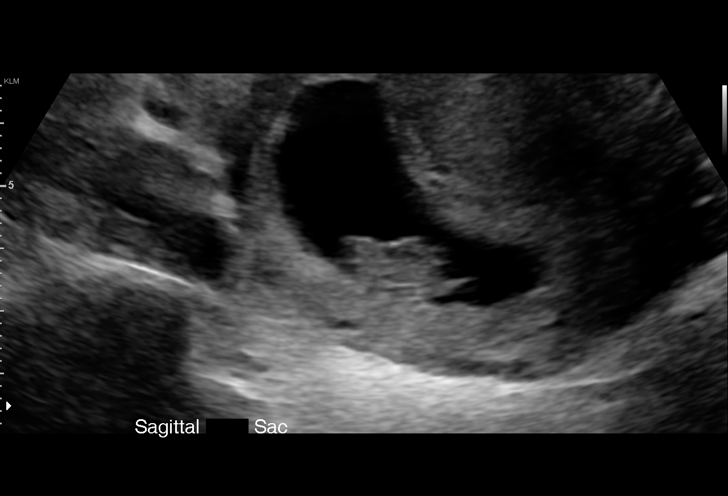

[15 of 28 positions shown; findings below may reference images not displayed]

FINDINGS: Intrauterine gestational sac: Single

Yolk sac:  Visualized.

Embryo:  Visualized.

Cardiac Activity: Visualized.

Heart Rate: 167 bpm

CRL: 22.7 mm   8 w 6 d                  US EDC: 10/08/2020

Subchorionic hemorrhage:  None visualized.

Maternal uterus/adnexae: Left ovary measures 3.6 x 2.9 x 2.4 cm,
with probable corpus luteum cyst. Right ovary measures 1.9 x 2.1 x
1.4 cm. No free fluid or pelvic mass.
IMPRESSION: 1. Single live intrauterine pregnancy as above, estimated age 8
weeks and 6 days.

## 2021-11-22 IMAGING — US US MFM OB FOLLOW-UP
1 series · 12 of 28 positions shown · non-contrast
Comparison: none

[Series 1: us mfm ob follow-up · 12 of 122 slices shown]
[im 5/122]
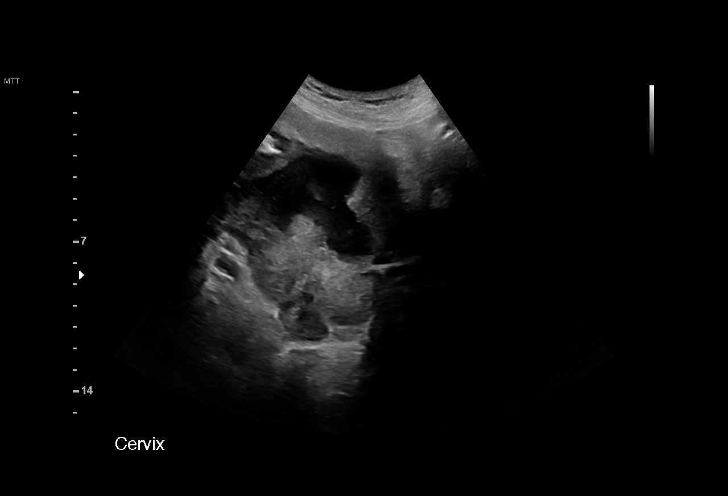
[im 14/122]
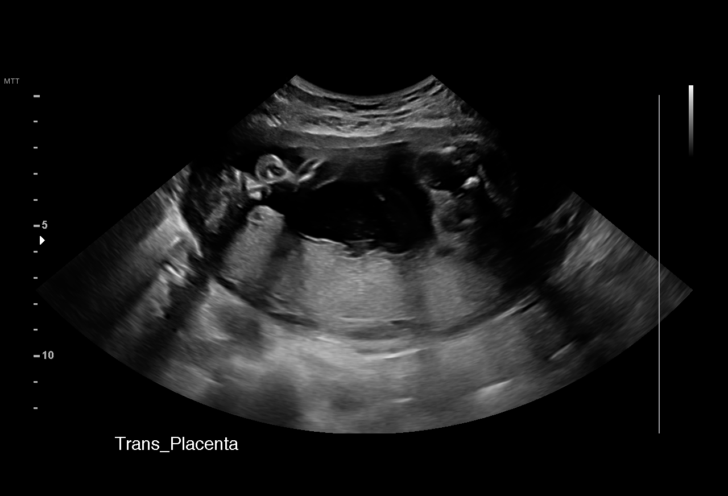
[im 23/122]
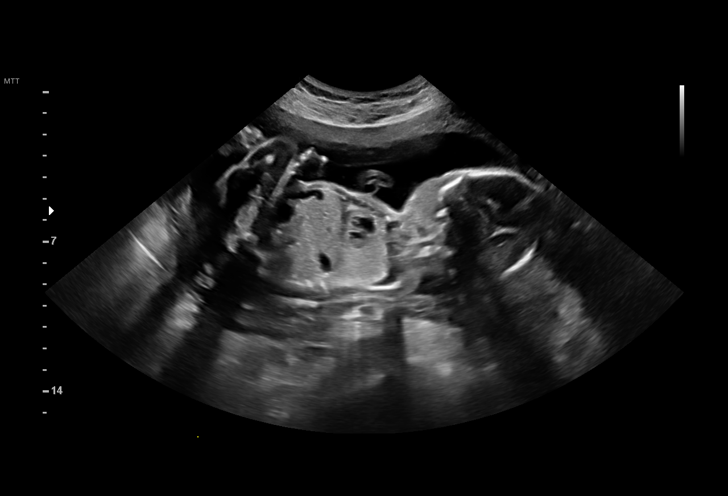
[im 36/122]
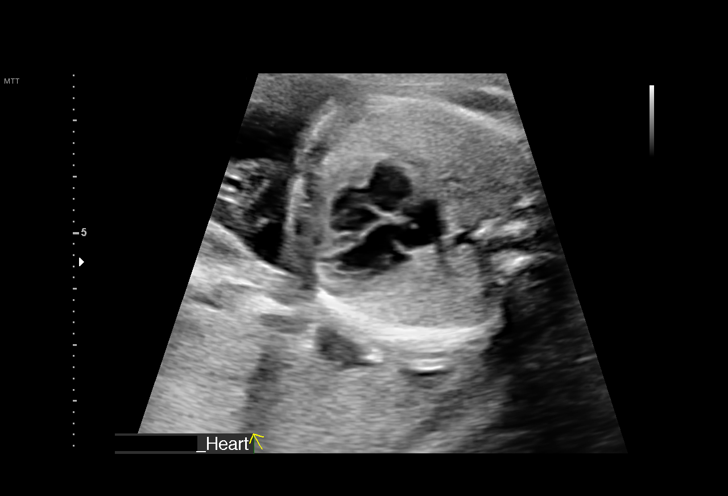
[im 45/122]
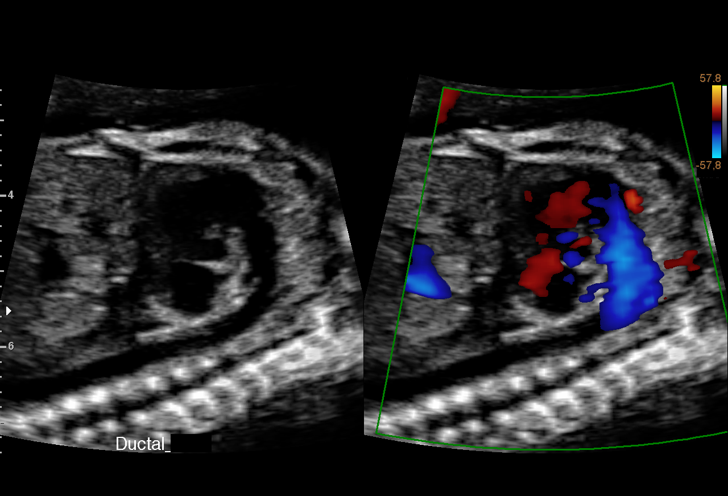
[im 54/122]
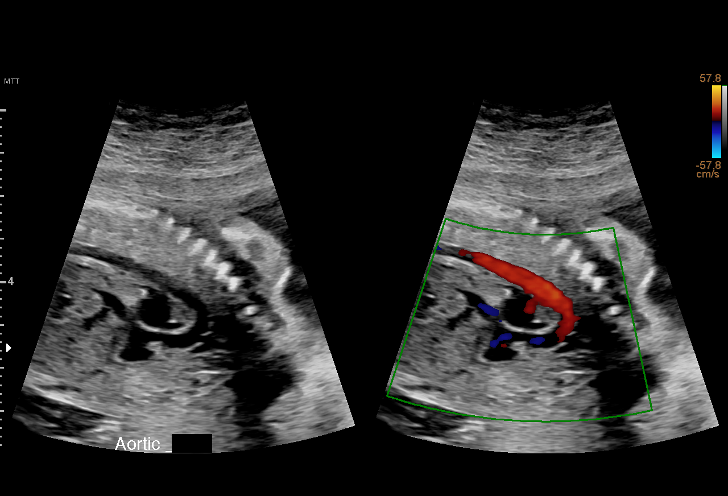
[im 68/122]
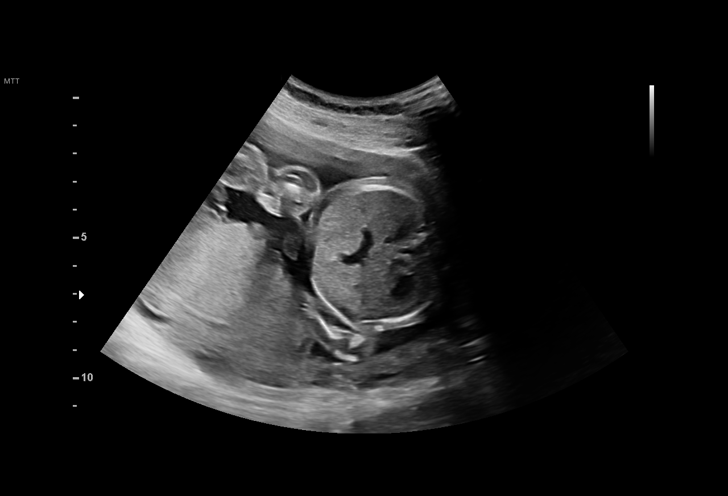
[im 77/122]
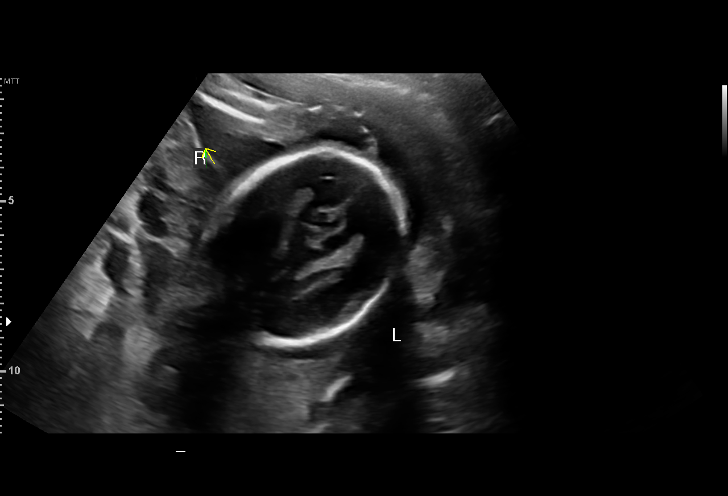
[im 86/122]
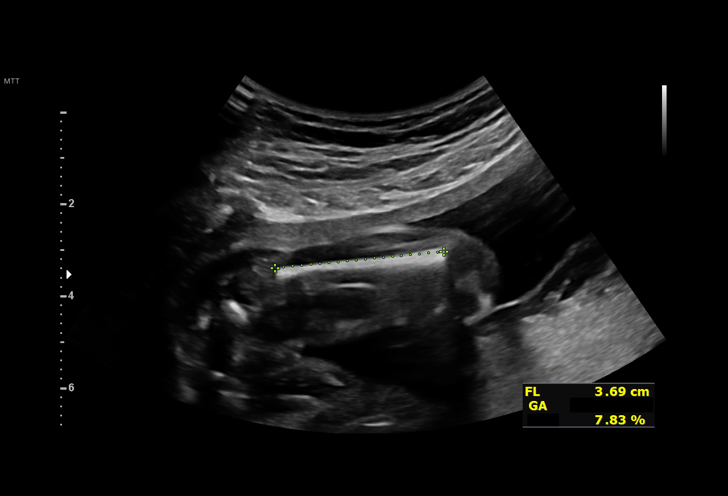
[im 99/122]
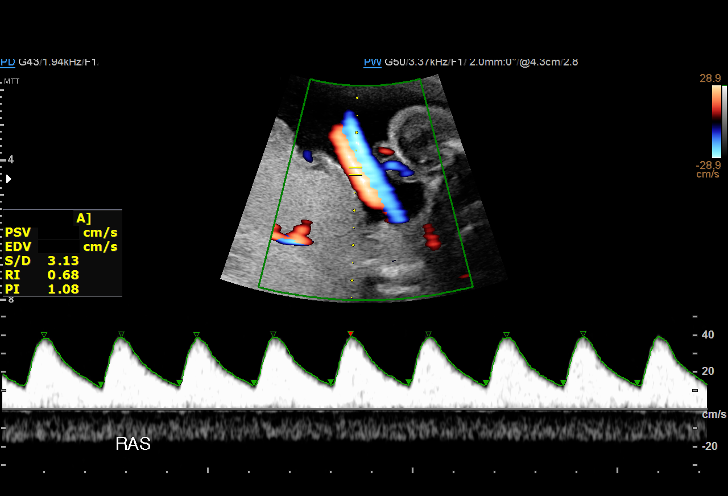
[im 108/122]
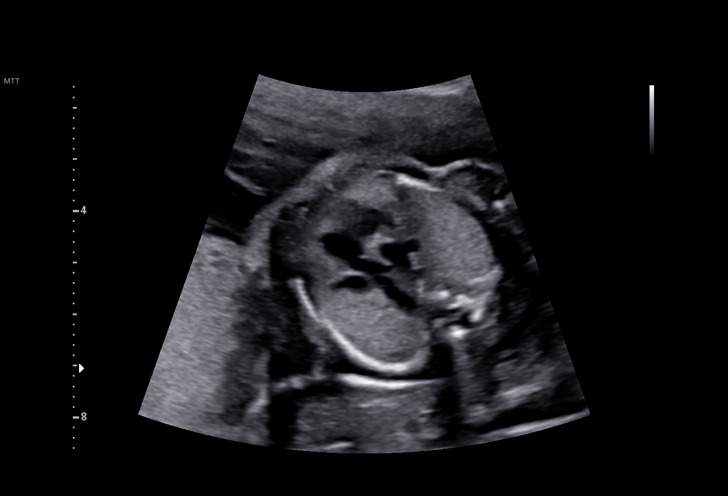
[im 117/122]
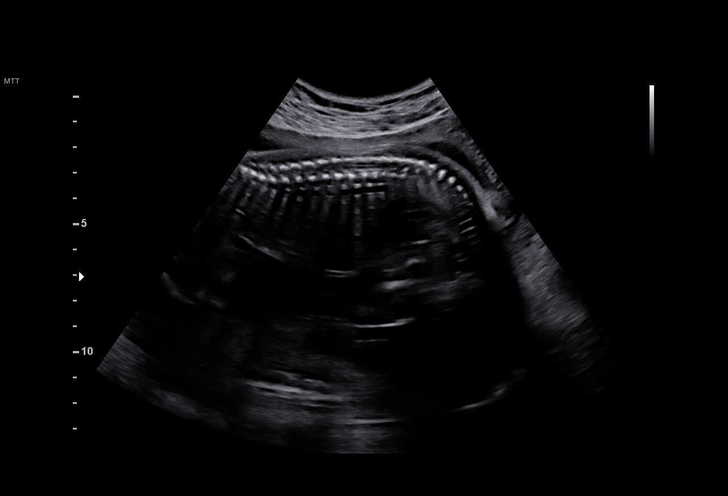

[12 of 28 positions shown; findings below may reference images not displayed]

[REDACTED]. [HOSPITAL],
                   DIPIETRO CNM

Indications

 Maternal care for known or suspected poor
 fetal growth, second trimester, not applicable
 or unspecified IUGR
 23 weeks gestation of pregnancy
 Advanced maternal age multigravida 35+,
 second trimester
 Poor obstetric history: Previous gestational
 diabetes
 Previous cesarean delivery, antepartum
 Encounter for other antenatal screening
 follow-up
Fetal Evaluation

 Num Of Fetuses:         1
 Fetal Heart Rate(bpm):  153
 Cardiac Activity:       Observed
 Presentation:           Cephalic
 Placenta:               Posterior
 P. Cord Insertion:      Previously Visualized

 Amniotic Fluid
 AFI FV:      Within normal limits

                             Largest Pocket(cm)

Biometry

 BPD:      51.5  mm     G. Age:  21w 4d          6  %    CI:        74.63   %    70 - 86
                                                         FL/HC:      19.8   %    19.2 -
 HC:      189.2  mm     G. Age:  21w 1d        < 1  %    HC/AC:      1.10        1.05 -
 AC:      172.7  mm     G. Age:  22w 2d         19  %    FL/BPD:     72.6   %    71 - 87
 FL:       37.4  mm     G. Age:  21w 6d         11  %    FL/AC:      21.7   %    20 - 24
 HUM:      35.2  mm     G. Age:  22w 1d         25  %

 LV:        5.7  mm

 Est. FW:     466  gm           1 lb      8  %
OB History

 Blood Type:   O+
 Gravidity:    4         Term:   1        Prem:   0        SAB:   2
 TOP:          1       Ectopic:  0        Living: 1
Gestational Age

 LMP:           23w 0d        Date:  01/01/20                 EDD:   10/07/20
 Clinical EDD:  23w 0d                                        EDD:   10/07/20
 U/S Today:     21w 5d                                        EDD:   10/16/20
 Best:          23w 0d     Det. By:  Clinical EDD             EDD:   10/07/20
Anatomy

 Cranium:               Previously seen        Aortic Arch:            Appears normal
 Cavum:                 Previously seen        Ductal Arch:            Appears normal
 Ventricles:            Appears normal         Diaphragm:              Appears normal
 Choroid Plexus:        Appears normal         Stomach:                Appears normal, left
                                                                       sided
 Cerebellum:            Previously seen        Abdomen:                Appears normal
 Posterior Fossa:       Previously seen        Abdominal Wall:         Appears nml (cord
                                                                       insert, abd wall)
 Nuchal Fold:           Previously seen        Cord Vessels:           Appears normal (3
                                                                       vessel cord)
 Face:                  Appears normal         Kidneys:                Appear normal
                        (orbits and profile)
 Lips:                  Previously seen        Bladder:                Appears normal
 Thoracic:              Previously seen        Spine:                  Appears normal
 Heart:                 Appears normal         Upper Extremities:      Appears normal
                        (4CH, axis, and
                        situs)
 RVOT:                  Appears normal         Lower Extremities:      Appears normal
 LVOT:                  Appears normal

 Other:  Female gender previously seen.  Technically difficult due to fetal
         position. Heels/feet and Left open hand/5th digit visualized.
Doppler - Fetal Vessels

 Umbilical Artery
  S/D     %tile      RI    %tile                             ADFV    RDFV
  3.72       54    0.73       56                                No      No

Cervix Uterus Adnexa
 Cervix
 Length:           3.06  cm.
 Normal appearance by transabdominal scan.

 Uterus
 No abnormality visualized.

 Right Ovary
 Within normal limits.

 Left Ovary
 Within normal limits.

 Cul De Sac
 No free fluid seen.

 Adnexa
 No abnormality visualized.
Comments

 This patient was seen due to a a possible IUGR fetus noted
 during her last ultrasound exam.  She denies any problems
 since her last exam.
 I reviewed her records in [REDACTED]. She had an 8-week ultrasound
 performed on March 04, 2020 that gave her an EDC October 07, 2020.
 Using an EDC October 07, 2020, the overall fetal growth
 continues to measure at the 8th percentile for her gestational
 age, indicating fetal growth restriction.  There was normal
 amniotic fluid noted.
 The fetal cardiac views were visualized today.  There were no
 obvious anomalies noted.  The limitations of ultrasound in the
 detection of all anomalies was discussed.
 Doppler studies of the umbilical arteries performed due to
 fetal growth restriction showed a normal S/D ratio of 3.72.
 There were no signs of absent or reversed end-diastolic flow
 noted today.
 The increased risk of fetal aneuploidy due to early fetal
 growth restriction was discussed.  The patient was offered
 and declined an amniocentesis for definitive diagnosis of fetal
 aneuploidy.  She is comfortable with her negative cell free
 DNA test.

 Due to fetal growth restriction, another umbilical artery
 Doppler study was scheduled in 2 weeks.  We will reassess
 the fetal growth again in 3 weeks.

 The EDC October 07, 2020 should be used as her final EDC.

## 2021-12-20 IMAGING — US US MFM UA CORD DOPPLER
1 series · 15 of 28 positions shown · non-contrast
Comparison: none

[Series 1: us mfm ua cord doppler · 31 acquisitions, 15 frames shown]
[im 1/31]
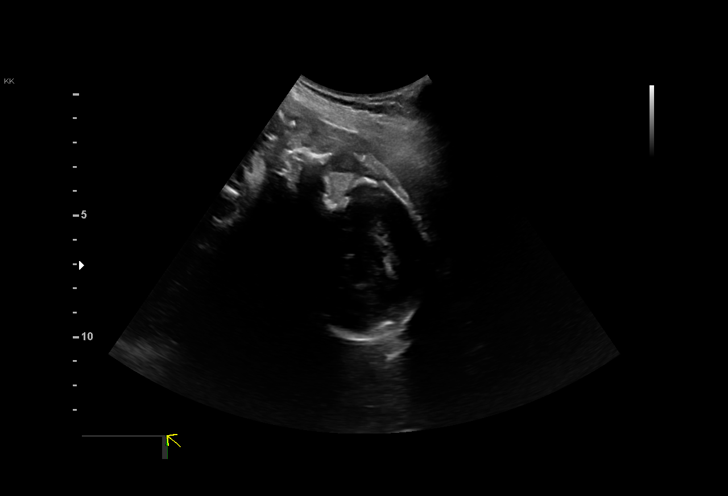
[im 3/31]
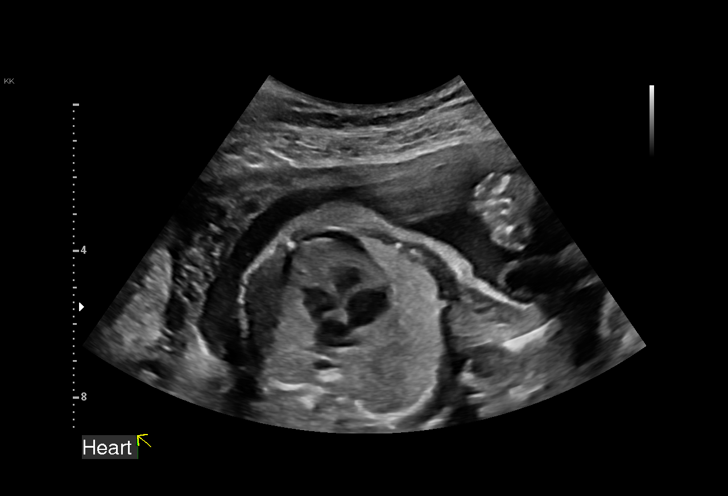
[im 5/31]
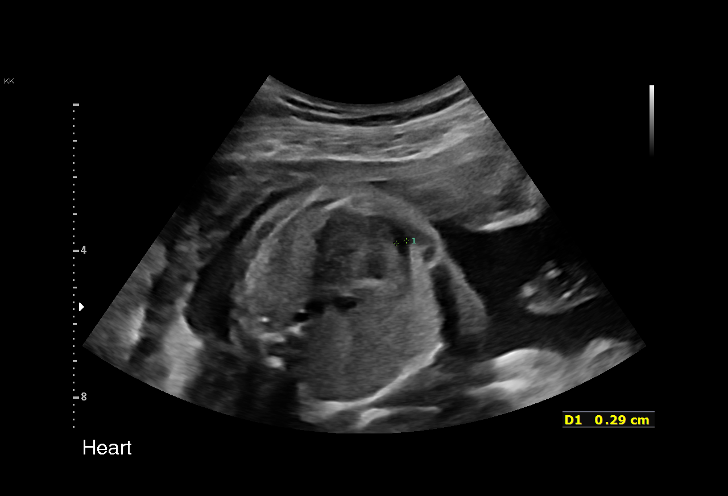
[im 7/31]
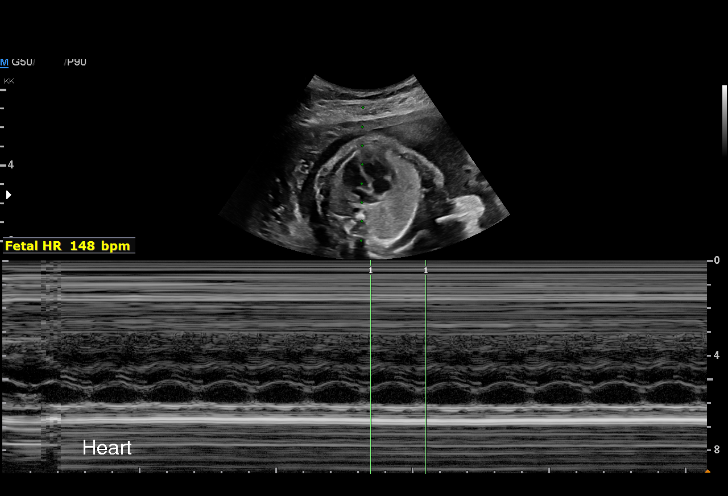
[im 9/31]
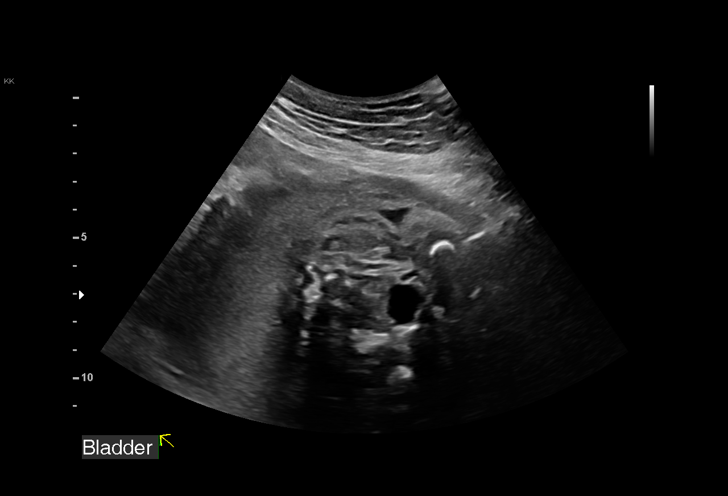
[im 12/31]
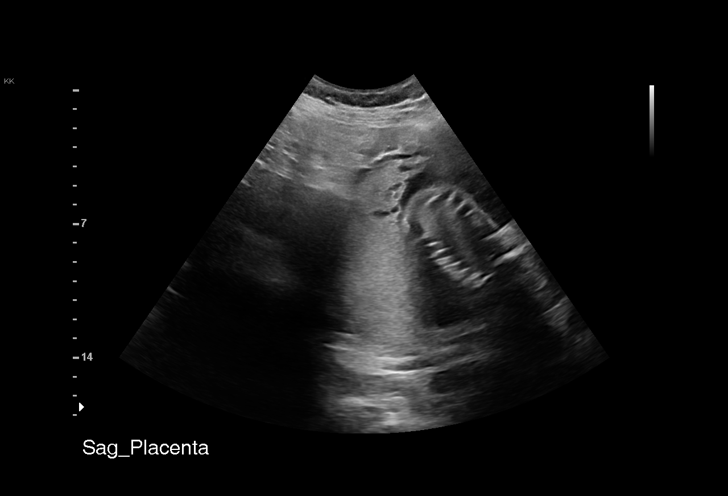
[im 14/31]
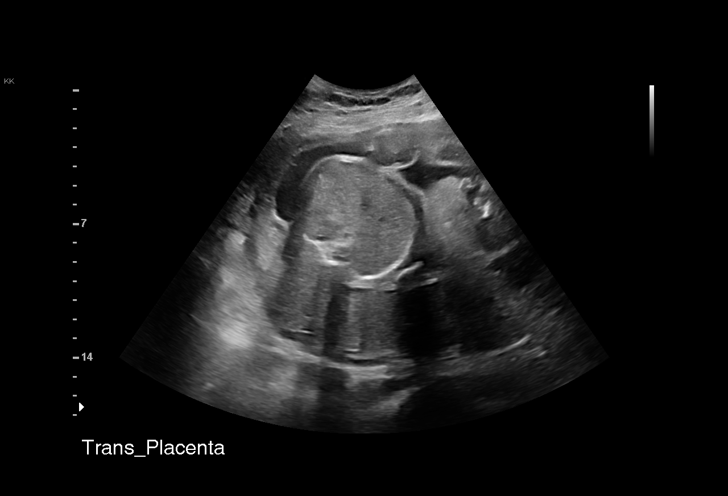
[im 16/31]
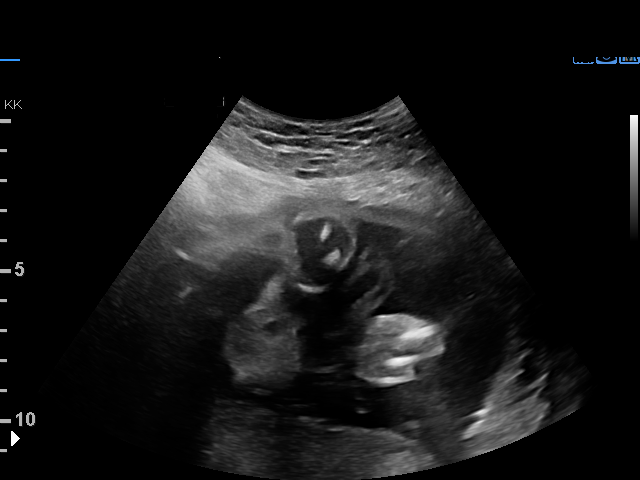
[im 17/31]
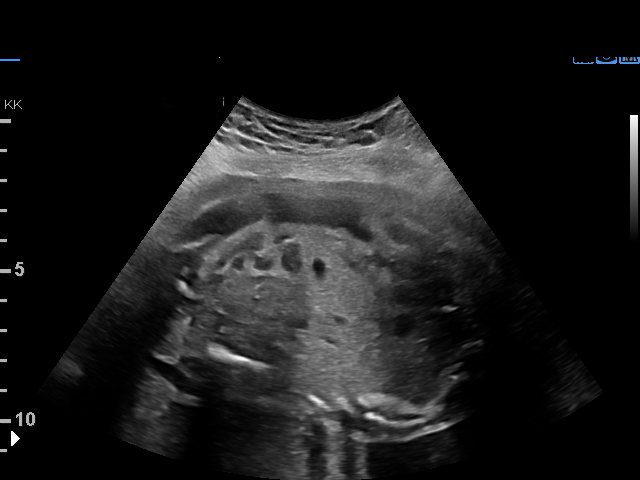
[im 19/31]
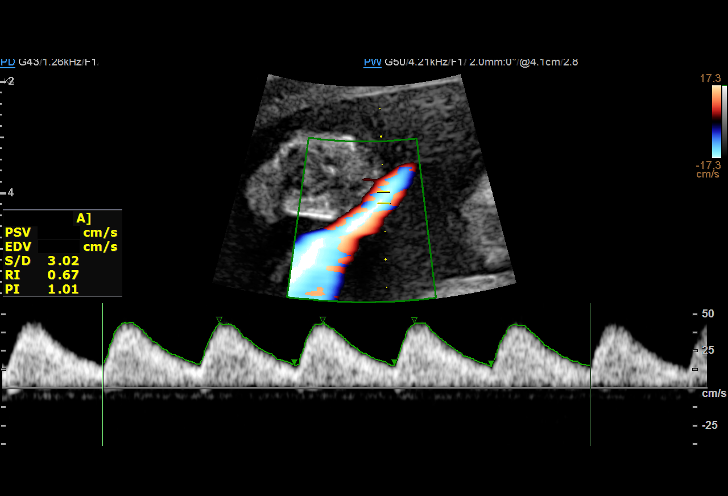
[im 22/31]
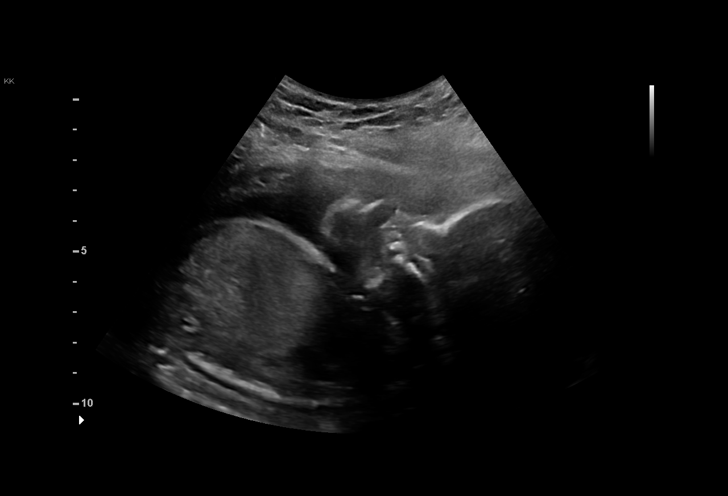
[im 24/31]
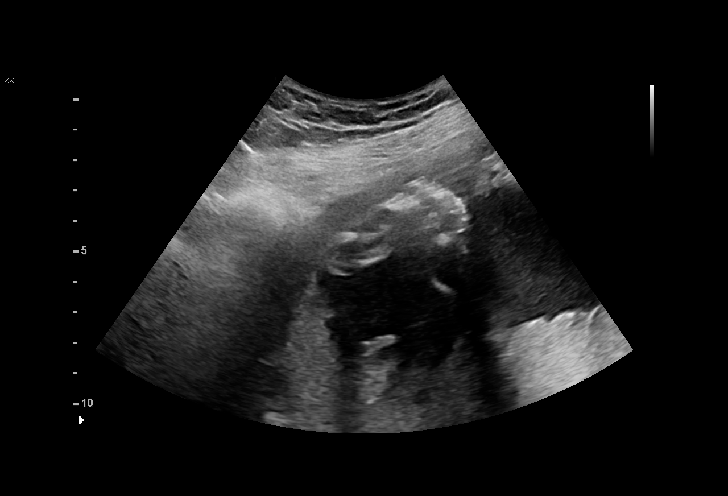
[im 26/31]
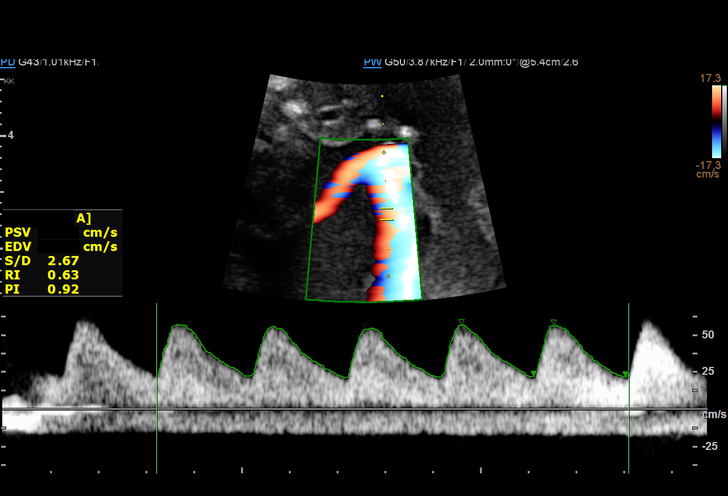
[im 28/31]
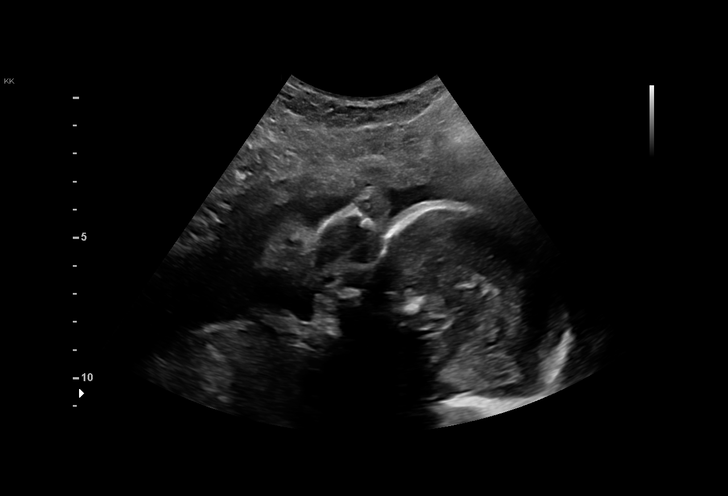
[im 31/31]
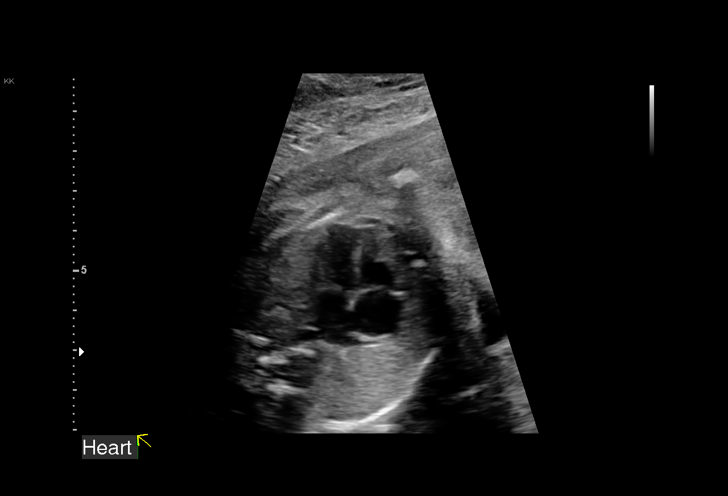

[15 of 28 positions shown; findings below may reference images not displayed]

[REDACTED]. [HOSPITAL],
                   MASI CNM

Indications

 27 weeks gestation of pregnancy
 Advanced maternal age multigravida 35+,
 second trimester
 Poor obstetric history: Previous gestational
 diabetes
 Previous cesarean delivery, antepartum
 Maternal care for known or suspected poor
 fetal growth, second trimester, not applicable
 or unspecified IUGR
 Low Risk NIPS (Negative AFP)
Fetal Evaluation

 Num Of Fetuses:         1
 Fetal Heart Rate(bpm):  148
 Cardiac Activity:       Observed
 Presentation:           Cephalic
 Placenta:               Posterior
 P. Cord Insertion:      Previously Visualized

 Amniotic Fluid
 AFI FV:      Within normal limits

                             Largest Pocket(cm)

Biophysical Evaluation
 N.S.T:          Reactive                   Score:          [DATE]
OB History

 Blood Type:   O+
 Gravidity:    4         Term:   1        Prem:   0        SAB:   2
 TOP:          1       Ectopic:  0        Living: 1
Gestational Age

 LMP:           27w 0d        Date:  01/01/20                 EDD:   10/07/20
 Clinical EDD:  27w 0d                                        EDD:   10/07/20
 Best:          27w 0d     Det. By:  LMP  (01/01/20)          EDD:   10/07/20
Doppler - Fetal Vessels

 Umbilical Artery
  S/D     %tile      RI    %tile      PI    %tile            ADFV    RDFV
  3.07       47    0.67       50    1.02       48               No      No

Impression

 Antenatal testing due to IUGR with an EFW 7.4th% and AC <
 16th% in prior growth exam.
 NST reactive with good fetal movement and amniotic fluid
 volume
 UA Dopplers are normal with no evidence of AEDF or REDF
Recommendations

 Follow up weekly testing with UA Dopplers and NST until 32
 weeks.

## 2022-01-05 IMAGING — US US MFM OB FOLLOW-UP
1 series · 13 of 27 positions shown · non-contrast
Comparison: none

[Series 1: us mfm ob follow-up · 27 acquisitions, 13 frames shown]
[im 2/27]
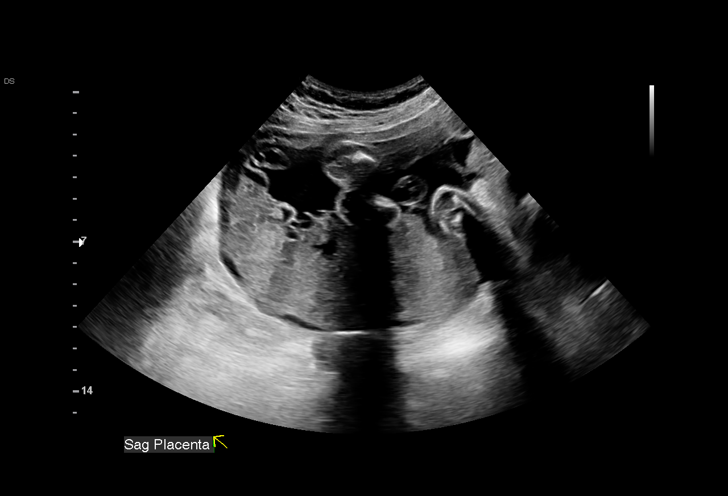
[im 4/27]
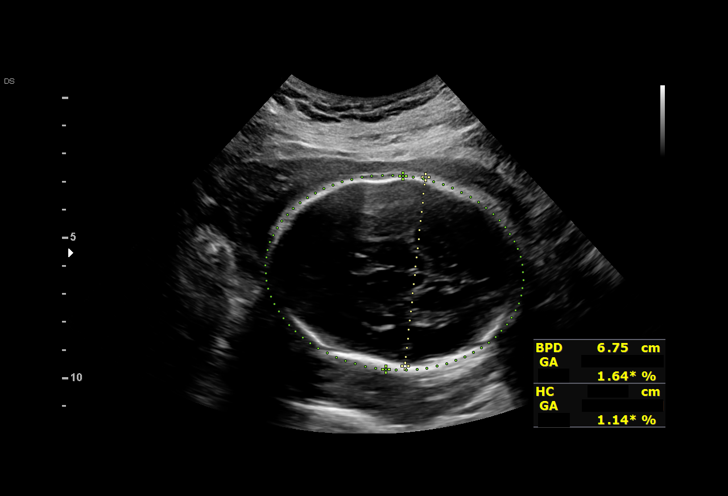
[im 6/27]
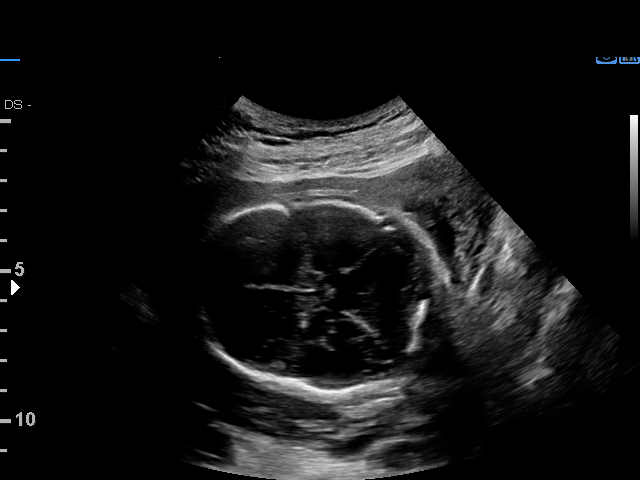
[im 8/27]
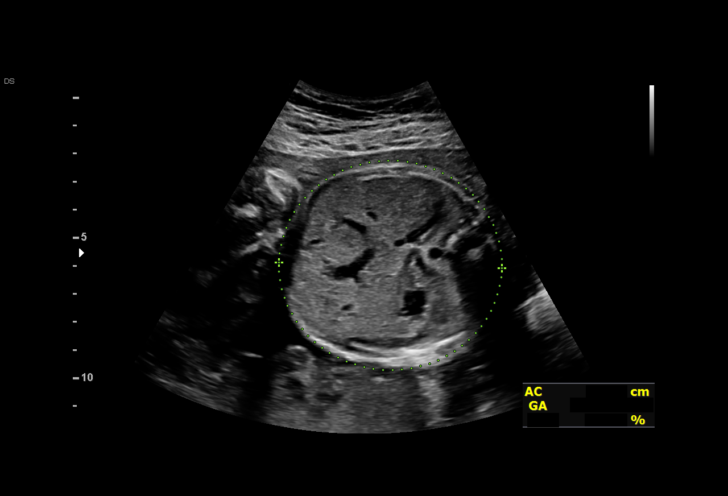
[im 10/27]
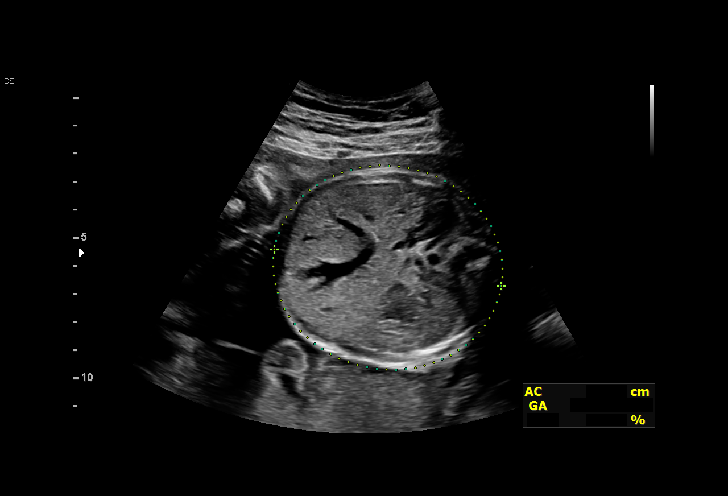
[im 12/27]
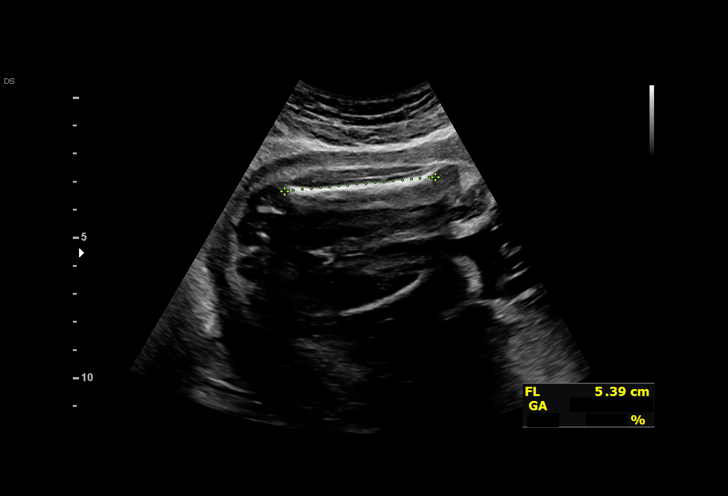
[im 14/27]
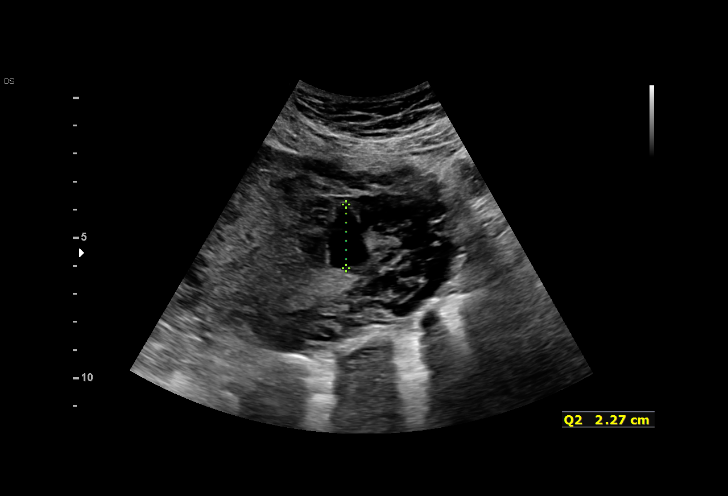
[im 16/27]
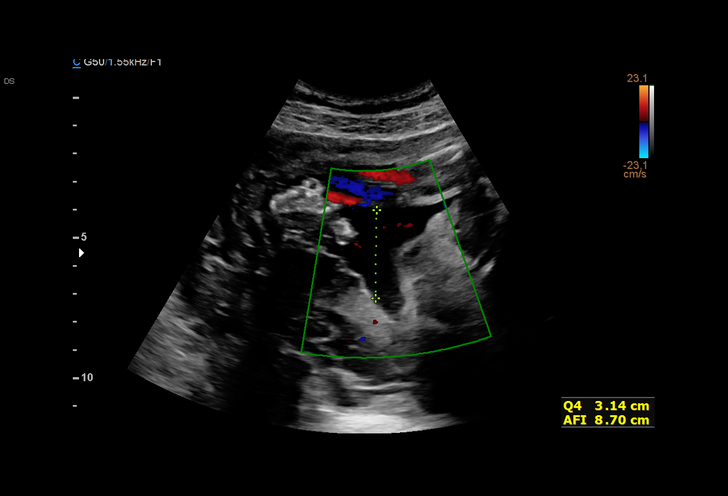
[im 18/27]
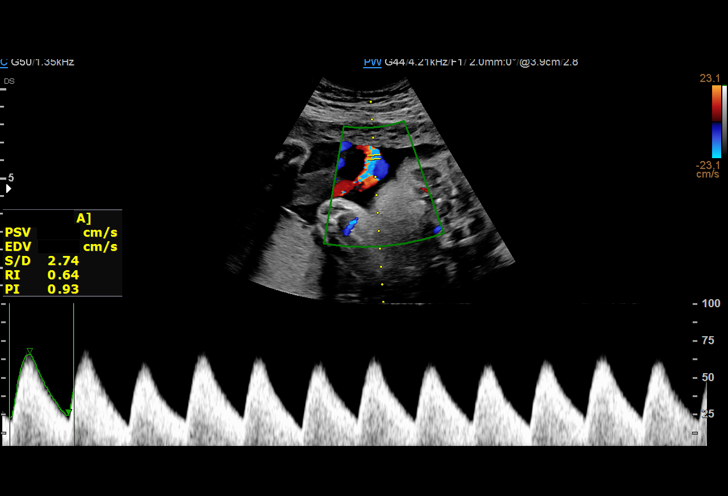
[im 20/27]
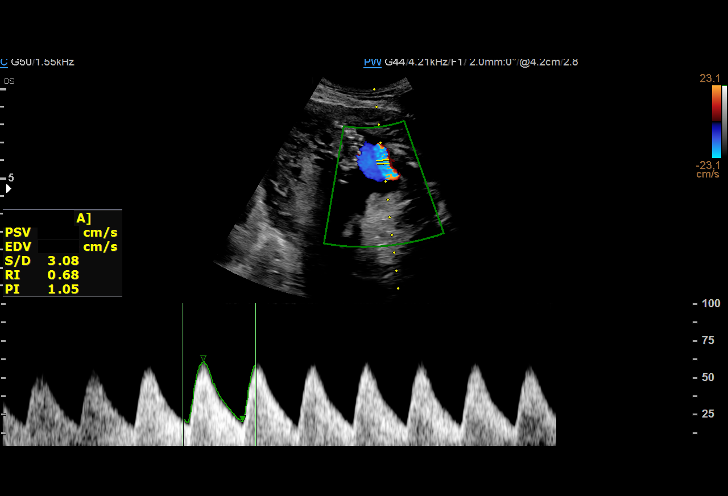
[im 22/27]
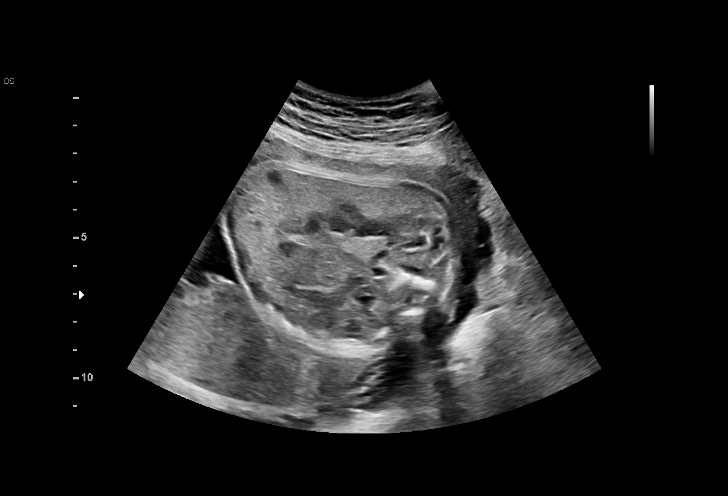
[im 24/27]
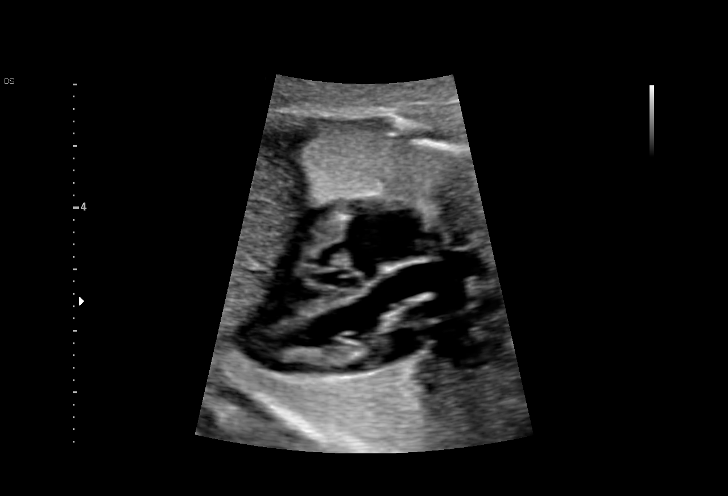
[im 26/27]
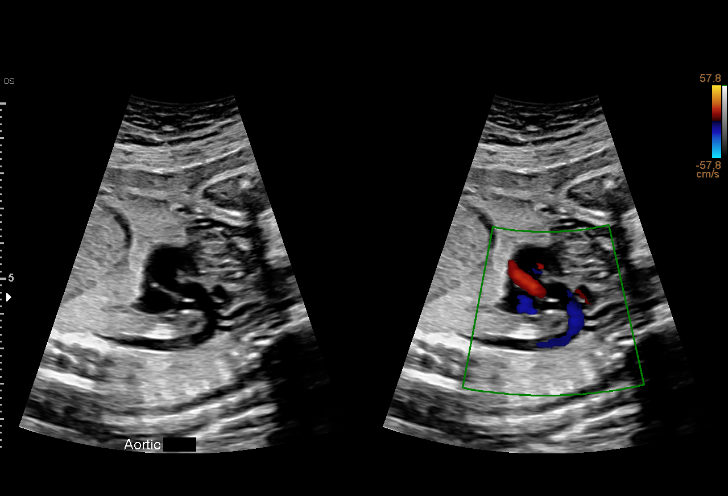

[13 of 27 positions shown; findings below may reference images not displayed]

[REDACTED]. [HOSPITAL],
                   NOORANI CNM

Indications

 Maternal care for known or suspected poor
 fetal growth, third trimester, fetus 1 IUGR
 Advanced maternal age multigravida 35+,
 second trimester
 Poor obstetric history: Previous gestational
 diabetes
 Previous cesarean delivery, antepartum
 29 weeks gestation of pregnancy
 Low Risk NIPS (Negative AFP)
Fetal Evaluation

 Num Of Fetuses:         1
 Cardiac Activity:       Observed
 Presentation:           Cephalic
 Placenta:               Posterior Fundal
 P. Cord Insertion:      Previously Visualized

 Amniotic Fluid
 AFI FV:      Within normal limits

 AFI Sum(cm)     %Tile       Largest Pocket(cm)
 10.65           18

 RUQ(cm)       RLQ(cm)       LUQ(cm)        LLQ(cm)

Biometry

 BPD:      67.6  mm     G. Age:  27w 2d        1.7  %    CI:        72.12   %    70 - 86
                                                         FL/HC:      21.1   %    19.6 -
 HC:      253.3  mm     G. Age:  27w 4d        < 1  %    HC/AC:      1.04        0.99 -
 AC:      243.1  mm     G. Age:  28w 4d         23  %    FL/BPD:     79.0   %    71 - 87
 FL:       53.4  mm     G. Age:  28w 2d         13  %    FL/AC:      22.0   %    20 - 24
 LV:        3.4  mm

 Est. FW:    5230  gm    2 lb 10 oz      11  %
OB History

 Blood Type:   O+
 Gravidity:    4         Term:   1        Prem:   0        SAB:   2
 TOP:          1       Ectopic:  0        Living: 1
Gestational Age

 LMP:           29w 2d        Date:  01/01/20                 EDD:   10/07/20
 Clinical EDD:  29w 2d                                        EDD:   10/07/20
 U/S Today:     28w 0d                                        EDD:   10/16/20
 Best:          29w 2d     Det. By:  LMP  (01/01/20)          EDD:   10/07/20
Anatomy

 Cranium:               Appears normal         Heart:                  Appears normal
                                                                       (4CH, axis, and
                                                                       situs)
 Cavum:                 Appears normal         RVOT:                   Appears normal
 Ventricles:            Appears normal         LVOT:                   Appears normal
 Choroid Plexus:        Appears normal         Aortic Arch:            Appears normal
 Cerebellum:            Appears normal         Stomach:                Appears normal, left
                                                                       sided
 Posterior Fossa:       Appears normal         Kidneys:                Appear normal
 Face:                  Appears normal         Bladder:                Appears normal
                        (orbits and profile)
 Lips:                  Appears normal

 Other:  Other anatomy previously imaged and appeared normal.
Doppler - Fetal Vessels

 Umbilical Artery
  S/D     %tile      RI    %tile      PI    %tile     PSV    ADFV    RDFV
                                                    (cm/s)
  3.05       58    0.67       61    0.[REDACTED]      No      No

Comments

 This patient was seen for a follow up growth scan due to fetal
 growth restriction noted during her prior ultrasound exams.
 She denies any problems since her last exam and reports
 feeling vigorous fetal movements throughout the day.
 On today's exam, the EFW measures at the 11th percentile
 for her gestational age.  The fetus has grown over 1 pound
 over the past 3 weeks.  There was normal amniotic fluid
 noted.
 Doppler studies of the umbilical arteries showed a normal
 S/D ratio of 3.05.  There were no signs of absent or reversed
 end-diastolic flow.
 She had a reactive nonstress test following her ultrasound
 exam today.
 Due to borderline fetal growth restriction, we will continue to
 follow her with weekly fetal testing and umbilical artery
 Doppler studies.
 She will return in 1 week for another NST and umbilical artery
 Doppler study.

## 2022-11-08 ENCOUNTER — Ambulatory Visit: Payer: Medicaid Other | Admitting: Obstetrics and Gynecology

## 2022-11-17 ENCOUNTER — Ambulatory Visit: Payer: Medicaid Other | Admitting: Obstetrics and Gynecology

## 2022-11-17 ENCOUNTER — Encounter: Payer: Self-pay | Admitting: Obstetrics and Gynecology

## 2022-12-15 ENCOUNTER — Ambulatory Visit (INDEPENDENT_AMBULATORY_CARE_PROVIDER_SITE_OTHER): Payer: Medicaid Other | Admitting: Obstetrics and Gynecology

## 2022-12-15 ENCOUNTER — Other Ambulatory Visit: Payer: Self-pay

## 2022-12-15 ENCOUNTER — Encounter: Payer: Self-pay | Admitting: Obstetrics and Gynecology

## 2022-12-15 VITALS — BP 113/74 | HR 59 | Wt 124.1 lb

## 2022-12-15 DIAGNOSIS — Z01419 Encounter for gynecological examination (general) (routine) without abnormal findings: Secondary | ICD-10-CM | POA: Diagnosis not present

## 2022-12-15 NOTE — Progress Notes (Signed)
ANNUAL EXAM Patient name: Claudia Lambert MRN [id number]  Date of birth: 10/08/1985 Chief Complaint:   Gynecologic Exam  History of Present Illness:   Claudia Lambert is a 37 y.o. [id number] being seen today for a routine annual exam.  Current complaints: anxiety  Menstrual concerns? No   Breast or nipple changes? No  Contraception use? Yes condoms Sexually active? Yes - monogamous female partner x10 years   New onset anxiety since delivery of her second child. Previously considered herself a "thrill seeker" but now with 2 children finds herself getting more anxious, especially when out and about with both children.   Patient's last menstrual period was 11/17/2022 (exact date).   The pregnancy intention screening data noted above was reviewed. Potential methods of contraception were discussed. The patient elected to proceed with No data recorded.   Last pap     Component Value Date/Time   DIAGPAP  11/03/2020 0905    - Negative for intraepithelial lesion or malignancy (NILM)   HPVHIGH Negative 11/03/2020 0905   ADEQPAP  11/03/2020 0905    Satisfactory for evaluation; transformation zone component PRESENT.     H/O abnormal pap: no Last mammogram: n/a Last colonoscopy: n/a.      12/15/2022   11:23 AM 11/03/2020    8:40 AM 09/26/2020   12:14 PM 09/16/2020    2:04 PM 09/11/2020    4:37 PM  Depression screen PHQ 2/9  Decreased Interest  0 0 0 0  Down, Depressed, Hopeless 1 0 0 0 0  PHQ - 2 Score 1 0 0 0 0  Altered sleeping 0 0 0 0 0  Tired, decreased energy 1 0 0 0 0  Change in appetite 3 0 0 0 0  Feeling bad or failure about yourself  0 0 0 0 0  Trouble concentrating 0 0 0 0 0  Moving slowly or fidgety/restless 0 0 0 0 0  Suicidal thoughts 0 0 0 0 0  PHQ-9 Score 5 0 0 0 0        11/03/2020    8:40 AM 09/26/2020   12:14 PM 09/16/2020    2:04 PM 09/11/2020    4:38 PM  GAD 7 : Generalized Anxiety Score  Nervous, Anxious, on Edge 0 0 0 0  Control/stop worrying 0 0 0 0  Worry too  much - different things 0 0 0 0  Trouble relaxing 0 0 0 0  Restless 0 0 0 0  Easily annoyed or irritable 0 0 0 0  Afraid - awful might happen 0 0 0 0  Total GAD 7 Score 0 0 0 0     Review of Systems:   Pertinent items are noted in HPI Denies any headaches, blurred vision, fatigue, shortness of breath, chest pain, abdominal pain, abnormal vaginal discharge/itching/odor/irritation, problems with periods, bowel movements, urination, or intercourse unless otherwise stated above. Pertinent History Reviewed:  Reviewed past medical,surgical, social and family history.  Reviewed problem list, medications and allergies. Physical Assessment:   Vitals:   12/15/22 1109  BP: 113/74  Pulse: (!) 59  Weight: 124 lb 1.6 oz (56.3 kg)  Body mass index is 20.03 kg/m.        Physical Examination:   General appearance - well appearing, and in no distress  Mental status - alert, oriented to person, place, and time  Psych:  She has a normal mood and affect  Skin - warm and dry, normal color, no suspicious lesions noted  Chest - effort normal,  all lung fields clear to auscultation bilaterally  Heart - normal rate and regular rhythm  Breasts - breasts appear normal, no suspicious masses, no skin or nipple changes or axillary nodes  Abdomen - soft, nontender, nondistended, no masses or organomegaly  Pelvic - deferred  Extremities:  No swelling or varicosities noted  Chaperone present for exam  No results found for this or any previous visit (from the past 24 hour(s)).    Assessment & Plan:   1. Well woman exam with routine gynecological exam - Cervical cancer screening: Discussed guidelines. Pap with HPV due 2025 - Gardasil: completed - GC/CT: declines - Birth Control: Condoms - Breast Health: Encouraged self breast awareness/SBE. Teaching provided.  - F/U 12 months and prn  - follow up with Asher Muir re: anxiety   No orders of the defined types were placed in this encounter.   Meds: No  orders of the defined types were placed in this encounter.   Follow-up: No follow-ups on file.  Lorriane Shire, MD 12/15/2022 11:25 AM

## 2022-12-16 NOTE — BH Specialist Note (Unsigned)
Integrated Behavioral Health via Telemedicine Visit  12/28/2022 Claudia Lambert [id number]  Number of Integrated Behavioral Health Clinician visits: 1- Initial Visit  Session Start time: 0855   Session End time: 0936  Total time in minutes: 41   Referring Provider: Lorriane Shire, MD Patient/Family location: Home [facility] Provider location: [facility]  All persons participating in visit: Patient Claudia Lambert and Claudia Lambert   Types of Service: Individual psychotherapy and Video visit  I connected with Claudia Lambert and/or Claudia Lambert's  n/a  via  Telephone or Video Enabled Telemedicine Application  (Video is Caregility application) and verified that I am speaking with the correct person using two identifiers. Discussed confidentiality: Yes   I discussed the limitations of telemedicine and the availability of in person appointments.  Discussed there is a possibility of technology failure and discussed alternative modes of communication if that failure occurs.  I discussed that engaging in this telemedicine visit, they consent to the provision of behavioral healthcare and the services will be billed under their insurance.  Patient and/or legal guardian expressed understanding and consented to Telemedicine visit: Yes   Presenting Concerns: Patient and/or family reports the following symptoms/concerns: Increased anxiety and panic attacks, that began after second child was born; coping by deep breathing, writing in journal and reading Bible. Pt worries that she's "lost a lot of weight", lost the ambition she used to have; triggered by being "spoken to in certain ways; don't like to be disrespected"; notices she gets anxious if her routine is disrupted; worries a lot. Pt is very open to implementing self-coping strategies today; interested in finding ongoing therapist.  Duration of problem: Two years; Severity of problem:  moderate  Patient and/or Family's Strengths/Protective Factors: Concrete supports in place (healthy food, safe environments, etc.) and Sense of purpose  Goals Addressed: Patient will:  Reduce symptoms of: anxiety, depression, and stress   Increase knowledge and/or ability of: self-management skills and stress reduction   Demonstrate ability to: Increase healthy adjustment to current life circumstances and Increase motivation to adhere to plan of care  Progress towards Goals: Ongoing  Interventions: Interventions utilized:  Mindfulness or Relaxation Training and CBT Cognitive Behavioral Therapy Standardized Assessments completed: Not Needed  Patient and/or Family Response: Patient agrees with treatment plan.   Assessment: Patient currently experiencing Generalized anxiety disorder.   Patient may benefit from psychoeducation and brief therapeutic interventions regarding coping with symptoms of anxiety with panic; depression .  Plan: Follow up with behavioral health clinician on : Two weeks Behavioral recommendations:  -Continue using self-coping strategies that have remained helpful (deep breathing at onset of panic, journal; read Bible) daily -CALM relaxation breathing exercise twice daily (morning; at bedtime); as needed throughout the day. -Begin Worry Time strategy, as discussed. Start by setting up start and end time reminders on phone today; continue daily for two weeks. -Continue plan to establish with ongoing therapist of choice (some options on After Visit Summary) Referral(s): Integrated [employer] (In Clinic) and Counselor  I discussed the assessment and treatment plan with the patient and/or parent/guardian. They were provided an opportunity to ask questions and all were answered. They agreed with the plan and demonstrated an understanding of the instructions.   They were advised to call back or seek an in-person evaluation if the symptoms worsen or if  the condition fails to improve as anticipated.  Claudia Lips, LCSW     12/15/2022   11:23 AM 11/03/2020  8:40 AM 09/26/2020   12:14 PM 09/16/2020    2:04 PM 09/11/2020    4:37 PM  Depression screen PHQ 2/9  Decreased Interest 0 0 0 0 0  Down, Depressed, Hopeless 1 0 0 0 0  PHQ - 2 Score 1 0 0 0 0  Altered sleeping 0 0 0 0 0  Tired, decreased energy 1 0 0 0 0  Change in appetite 3 0 0 0 0  Feeling bad or failure about yourself  0 0 0 0 0  Trouble concentrating 0 0 0 0 0  Moving slowly or fidgety/restless 0 0 0 0 0  Suicidal thoughts 0 0 0 0 0  PHQ-9 Score 5 0 0 0 0      12/15/2022   11:28 AM 11/03/2020    8:40 AM 09/26/2020   12:14 PM 09/16/2020    2:04 PM  GAD 7 : Generalized Anxiety Score  Nervous, Anxious, on Edge 2 0 0 0  Control/stop worrying 0 0 0 0  Worry too much - different things 2 0 0 0  Trouble relaxing 0 0 0 0  Restless 0 0 0 0  Easily annoyed or irritable 1 0 0 0  Afraid - awful might happen 0 0 0 0  Total GAD 7 Score 5 0 0 0

## 2022-12-28 ENCOUNTER — Ambulatory Visit (INDEPENDENT_AMBULATORY_CARE_PROVIDER_SITE_OTHER): Payer: Medicaid Other | Admitting: Clinical

## 2022-12-28 DIAGNOSIS — F411 Generalized anxiety disorder: Secondary | ICD-10-CM | POA: Diagnosis not present

## 2022-12-28 DIAGNOSIS — F410 Panic disorder [episodic paroxysmal anxiety] without agoraphobia: Secondary | ICD-10-CM | POA: Diagnosis not present

## 2022-12-28 NOTE — Patient Instructions (Signed)
[facility] at [facility] [address] [place], [place] [address] [phone] (main office) [phone] [facility] office)     Some options with ongoing therapy:     Find a Therapist on:  www.[email or web address] (Filter option to find therapists that take your insurance; any other options that are important to you).    [facility], [address], [place], [place]  [phone] Fax: [phone] [email or web address] *Interpreters available *Accepts all insurance and uninsured for Urgent Care needs *Accepts Medicaid and uninsured for outpatient treatment   [facility]   Mon-Fri: 8am-5pm [address], [place], [place] [phone](FIEPP); [phone](fax) [email or web address]  *Accepts Medicare  [facility] [place], Fri: 8am-4pm [address], [place], [place] [address] [phone] (phone); [phone] (fax) [email or web address]  *Accepts Medicare  [facility] Mon-Fri: 9am-5pm  [address] [place], [place] [phone] (phone); [phone]  [email or web address]  *Accepts Medicaid  [facility], 8:30am-12pm/1pm-2:30pm [address], [place], [place] [phone] (phone); [phone] (fax) [email or web address].[email or web address]  *Accepts Medicaid, sliding-scale*Bilingual services available  [facility] Mon-Fri, 8am-7pm [address] [place], [place]  [phone](RCVEL); [phone](fax) [email or web address].[email or web address]  *Accepts Medicaid *Bilingual services available  [facility] Mon-Fri: 8am-5pm, Saturday by appointment only [address] [place], [place], [place] [phone] (phone); [phone] (fax) [email or web address]   [facility] [address], [place],  [place] [phone] [email or web address]  *Free & reduced services for uninsured and underinsured individuals *Bilingual services for Spanish-speaking clients 21 and under  [facility], [address] [place], [place] [phone](YKDXI); [phone](fax) [email or web address]  *Bring your own interpreter at first visit *Accepts Medicare and [facility]  [facility] Mon-Fri: 9am-5:30pm [address], [place], [place] [phone] (phone), [phone] (fax) After hours crisis line: [phone] [email or web address]  *Accepts Medicare and Medicaid  [employer] [facility], 8am-6pm [address], [place], [place]  [phone] (phone); [phone] (fax) [email or web address]  *Subsidized costs available  [facility] Mon-Fri: 8am-4pm [address], [place], [place] [phone](HFWYO); [phone](fax) [email or web address]  *Accepts Medicaid  [facility] Same day access hours: Mon-Fri, 8:30-3pm Crisis hours: Mon-Fri, 8am-5pm [address] [place], [place] [phone]  [facility] Same day access hours: Mon-Fri, 8:30-3pm Crisis hours: Mon-Fri, 8am-8pm [address] [place], [place] [phone] (phone); [phone] (fax) [email or web address].[email or web address]  *Accepts Medicaid and Medicare  [facility] [place], Fri: 9am-9pm Tues, Thurs: 9am-6pm [address] [place], [place], [place]  [phone] (phone); [phone] (fax) [email or web address]  *(Accepts Medicare and Medicaid; payment plans available)*Bilingual services available  [facility] [address] [place], [place] [phone] (phone); [phone] (fax) [email or web address]   [facility] [address], [place], [place]  [phone]  [email or web address]   *Bilingual services available  [facility] Mon-Thurs: 8am-8pm [address], [place], [place] [phone] (phone); [phone] (fax) [email or web address]  *Accepts Medicaid*Bilingual services available  [facility] [address] [place], [place] [phone] (phone) [email or web address]  *Accepts Medicaid *Bilingual services available  [facility] Mon-Fri, 9am-4:45pm [address], [place], [place] [phone] (phone); [phone] (fax) [email or web address]  *Accepts Medicare  [facility] Mon-Thurs: 8:30-8pm, Fri: 8:30am-7pm [address], [place], [place] (3rd floor) [phone] (phone); [phone] (fax) [email or web address]  *Accepts Medicaid; income-based reduced rates available  [facility] Mon-Fri: 8am-5pm [address], [place], [place] [address] [phone] (phone); [phone] (fax) [email or web address]  *Accepts Medicaid*Bilingual services available

## 2022-12-29 NOTE — BH Specialist Note (Signed)
Pt did not arrive to video visit and did not answer the phone; Left HIPPA-compliant message to call back Aryahna Spagna from [facility] at  [phone] (Ilir Mahrt's office).  ?; left MyChart message for patient.  ? ?

## 2023-01-12 ENCOUNTER — Ambulatory Visit: Payer: Medicaid Other | Admitting: Clinical

## 2023-01-12 DIAGNOSIS — Z91199 Patient's noncompliance with other medical treatment and regimen due to unspecified reason: Secondary | ICD-10-CM

## 2023-08-18 ENCOUNTER — Other Ambulatory Visit: Payer: Self-pay | Admitting: Registered Nurse

## 2023-08-18 ENCOUNTER — Ambulatory Visit
Admission: RE | Admit: 2023-08-18 | Discharge: 2023-08-18 | Disposition: A | Discharge status: Home / Self Care | Source: Ambulatory Visit | Attending: Registered Nurse | Admitting: Registered Nurse

## 2023-08-18 DIAGNOSIS — R058 Other specified cough: Secondary | ICD-10-CM
